# Patient Record
Sex: Female | Born: 1940 | Race: Black or African American | Hispanic: No | Marital: Single | Smoking: Never smoker
Health system: Southern US, Community
[De-identification: ages and names within clinical notes are randomized; demographics above are authoritative.]

## PROBLEM LIST (undated history)

## (undated) DIAGNOSIS — I10 Essential (primary) hypertension: Secondary | ICD-10-CM

## (undated) DIAGNOSIS — E119 Type 2 diabetes mellitus without complications: Secondary | ICD-10-CM

## (undated) DIAGNOSIS — N183 Chronic kidney disease, stage 3 unspecified: Secondary | ICD-10-CM

## (undated) DIAGNOSIS — E785 Hyperlipidemia, unspecified: Secondary | ICD-10-CM

## (undated) HISTORY — DX: Type 2 diabetes mellitus without complications: E11.9

## (undated) HISTORY — DX: Chronic kidney disease, stage 3 unspecified: N18.30

## (undated) HISTORY — DX: Hyperlipidemia, unspecified: E78.5

## (undated) HISTORY — DX: Essential (primary) hypertension: I10

---

## 1989-08-01 DIAGNOSIS — E039 Hypothyroidism, unspecified: Secondary | ICD-10-CM

## 1989-08-01 HISTORY — DX: Hypothyroidism, unspecified: E03.9

## 1999-06-15 ENCOUNTER — Encounter: Payer: Self-pay | Admitting: Internal Medicine

## 1999-06-15 ENCOUNTER — Encounter: Admission: RE | Admit: 1999-06-15 | Discharge: 1999-06-15 | Payer: Self-pay | Admitting: Internal Medicine

## 1999-06-17 ENCOUNTER — Ambulatory Visit (HOSPITAL_COMMUNITY): Admission: RE | Admit: 1999-06-17 | Discharge: 1999-06-17 | Payer: Self-pay | Admitting: Internal Medicine

## 1999-12-17 ENCOUNTER — Ambulatory Visit (HOSPITAL_COMMUNITY): Admission: RE | Admit: 1999-12-17 | Discharge: 1999-12-17 | Payer: Self-pay | Admitting: Internal Medicine

## 1999-12-22 ENCOUNTER — Ambulatory Visit (HOSPITAL_COMMUNITY): Admission: RE | Admit: 1999-12-22 | Discharge: 1999-12-22 | Payer: Self-pay | Admitting: Interventional Cardiology

## 1999-12-22 ENCOUNTER — Encounter: Payer: Self-pay | Admitting: Interventional Cardiology

## 2002-09-09 ENCOUNTER — Ambulatory Visit (HOSPITAL_COMMUNITY): Admission: RE | Admit: 2002-09-09 | Discharge: 2002-09-09 | Payer: Self-pay | Admitting: Internal Medicine

## 2009-05-22 ENCOUNTER — Encounter: Admission: RE | Admit: 2009-05-22 | Discharge: 2009-05-22 | Payer: Self-pay | Admitting: Internal Medicine

## 2010-01-14 ENCOUNTER — Encounter: Admission: RE | Admit: 2010-01-14 | Discharge: 2010-01-14 | Payer: Self-pay | Admitting: Internal Medicine

## 2010-08-22 ENCOUNTER — Encounter: Payer: Self-pay | Admitting: Internal Medicine

## 2014-09-11 DIAGNOSIS — Z1231 Encounter for screening mammogram for malignant neoplasm of breast: Secondary | ICD-10-CM | POA: Diagnosis not present

## 2014-09-23 DIAGNOSIS — D509 Iron deficiency anemia, unspecified: Secondary | ICD-10-CM | POA: Diagnosis not present

## 2014-09-23 DIAGNOSIS — Z23 Encounter for immunization: Secondary | ICD-10-CM | POA: Diagnosis not present

## 2014-09-23 DIAGNOSIS — Z0000 Encounter for general adult medical examination without abnormal findings: Secondary | ICD-10-CM | POA: Diagnosis not present

## 2014-09-23 DIAGNOSIS — E118 Type 2 diabetes mellitus with unspecified complications: Secondary | ICD-10-CM | POA: Diagnosis not present

## 2014-09-23 DIAGNOSIS — E780 Pure hypercholesterolemia: Secondary | ICD-10-CM | POA: Diagnosis not present

## 2014-09-23 DIAGNOSIS — Z6824 Body mass index (BMI) 24.0-24.9, adult: Secondary | ICD-10-CM | POA: Diagnosis not present

## 2014-09-23 DIAGNOSIS — D559 Anemia due to enzyme disorder, unspecified: Secondary | ICD-10-CM | POA: Diagnosis not present

## 2014-09-23 DIAGNOSIS — I10 Essential (primary) hypertension: Secondary | ICD-10-CM | POA: Diagnosis not present

## 2014-09-23 DIAGNOSIS — E039 Hypothyroidism, unspecified: Secondary | ICD-10-CM | POA: Diagnosis not present

## 2014-10-07 DIAGNOSIS — G5640 Causalgia of unspecified upper limb: Secondary | ICD-10-CM | POA: Diagnosis not present

## 2014-10-07 DIAGNOSIS — I10 Essential (primary) hypertension: Secondary | ICD-10-CM | POA: Diagnosis not present

## 2014-10-20 DIAGNOSIS — H35031 Hypertensive retinopathy, right eye: Secondary | ICD-10-CM | POA: Diagnosis not present

## 2014-11-19 DIAGNOSIS — I10 Essential (primary) hypertension: Secondary | ICD-10-CM | POA: Diagnosis not present

## 2014-11-19 DIAGNOSIS — E119 Type 2 diabetes mellitus without complications: Secondary | ICD-10-CM | POA: Diagnosis not present

## 2015-03-19 DIAGNOSIS — M545 Low back pain: Secondary | ICD-10-CM | POA: Diagnosis not present

## 2015-03-19 DIAGNOSIS — I10 Essential (primary) hypertension: Secondary | ICD-10-CM | POA: Diagnosis not present

## 2015-04-08 DIAGNOSIS — Z23 Encounter for immunization: Secondary | ICD-10-CM | POA: Diagnosis not present

## 2015-04-30 DIAGNOSIS — I10 Essential (primary) hypertension: Secondary | ICD-10-CM | POA: Diagnosis not present

## 2015-08-18 DIAGNOSIS — Z961 Presence of intraocular lens: Secondary | ICD-10-CM | POA: Diagnosis not present

## 2015-08-18 DIAGNOSIS — H26493 Other secondary cataract, bilateral: Secondary | ICD-10-CM | POA: Diagnosis not present

## 2015-08-18 DIAGNOSIS — H5021 Vertical strabismus, right eye: Secondary | ICD-10-CM | POA: Diagnosis not present

## 2015-09-16 DIAGNOSIS — Z1231 Encounter for screening mammogram for malignant neoplasm of breast: Secondary | ICD-10-CM | POA: Diagnosis not present

## 2015-09-28 DIAGNOSIS — M1990 Unspecified osteoarthritis, unspecified site: Secondary | ICD-10-CM | POA: Diagnosis not present

## 2015-09-28 DIAGNOSIS — D559 Anemia due to enzyme disorder, unspecified: Secondary | ICD-10-CM | POA: Diagnosis not present

## 2015-09-28 DIAGNOSIS — D539 Nutritional anemia, unspecified: Secondary | ICD-10-CM | POA: Diagnosis not present

## 2015-09-28 DIAGNOSIS — G5600 Carpal tunnel syndrome, unspecified upper limb: Secondary | ICD-10-CM | POA: Diagnosis not present

## 2015-09-28 DIAGNOSIS — E039 Hypothyroidism, unspecified: Secondary | ICD-10-CM | POA: Diagnosis not present

## 2015-09-28 DIAGNOSIS — I10 Essential (primary) hypertension: Secondary | ICD-10-CM | POA: Diagnosis not present

## 2015-09-28 DIAGNOSIS — Z0000 Encounter for general adult medical examination without abnormal findings: Secondary | ICD-10-CM | POA: Diagnosis not present

## 2015-10-12 DIAGNOSIS — I10 Essential (primary) hypertension: Secondary | ICD-10-CM | POA: Diagnosis not present

## 2015-10-22 ENCOUNTER — Emergency Department (INDEPENDENT_AMBULATORY_CARE_PROVIDER_SITE_OTHER)
Admission: EM | Admit: 2015-10-22 | Discharge: 2015-10-22 | Disposition: A | Discharge status: Home / Self Care | Payer: Medicare Other | Source: Home / Self Care | Attending: Family Medicine | Admitting: Family Medicine

## 2015-10-22 ENCOUNTER — Encounter (HOSPITAL_COMMUNITY): Payer: Self-pay | Admitting: Emergency Medicine

## 2015-10-22 DIAGNOSIS — L308 Other specified dermatitis: Principal | ICD-10-CM

## 2015-10-22 DIAGNOSIS — B028 Zoster with other complications: Secondary | ICD-10-CM

## 2015-10-22 MED ORDER — VALACYCLOVIR HCL 1 G PO TABS: 1000.0000 mg | ORAL_TABLET | Freq: Three times a day (TID) | ORAL | Status: AC

## 2015-10-22 NOTE — ED Provider Notes (Addendum)
CSN: [id number]     Arrival date & time 10/22/15  1351 History   First MD Initiated Contact with Patient 10/22/15 1648     Chief Complaint  Patient presents with  . Hip Pain  . Pelvic Pain   (Consider location/radiation/quality/duration/timing/severity/associated sxs/prior Treatment) Patient is a 75 y.o. female presenting with hip pain and pelvic pain. The history is provided by the patient. No language interpreter was used.  Hip Pain Pertinent negatives include no abdominal pain.  Pelvic Pain Pertinent negatives include no abdominal pain.  Patient presents with complaint of R sided inguinal discomfort, describes as a 'puffiness' which came on in association with a rash at the same time. The rash was pruritic initially, has been using otc hydrocortisone cream and is drying up, less itchy.  No changes in hygiene products, no other changes.  On ROS she notes she has had some increasing nausea over time, much before the chief complaint mentioned above.   PMHx; primary doctor is Dr. Levin Erp.   No past medical history on file. No past surgical history on file. No family history on file. Social History  Substance Use Topics  . Smoking status: Not on file  . Smokeless tobacco: Not on file  . Alcohol Use: Not on file   OB History    No data available     Review of Systems  Constitutional: Negative for fever, chills, diaphoresis and fatigue.  Gastrointestinal: Positive for nausea. Negative for vomiting, abdominal pain, diarrhea, constipation, blood in stool, abdominal distention and rectal pain.  Endocrine: Negative for polyuria.  Genitourinary: Positive for pelvic pain. Negative for dysuria, frequency, flank pain, difficulty urinating and dyspareunia.  Skin: Positive for rash.    Allergies  Review of patient's allergies indicates not on file.  Home Medications   Prior to Admission medications   Not on File   Meds Ordered and Administered this Visit  Medications - No data  to display  BP 163/70 mmHg  Pulse 62  Temp(Src) 98.4 F (36.9 C) (Oral)  Resp 16  SpO2 99% No data found.   Physical Exam  Constitutional: She appears well-developed and well-nourished. No distress.  HENT:  Head: Normocephalic.  Neck: Normal range of motion. Neck supple.  Cardiovascular: Normal rate, regular rhythm and normal heart sounds.   No murmur heard. Abdominal: Soft. She exhibits no distension. There is no tenderness. There is no rebound and no guarding.  Lymphadenopathy:    She has no cervical adenopathy.  Neurological:  Patient reports tingling along anterior aspect of RIGHT thigh, around R buttock.   Full strength hip flexion bilaterally.   Skin: She is not diaphoretic.  RIGHT anterior thigh with patch of erythema, with grouping of vesicular lesions in early phase.     ED Course  Procedures (including critical care time)  Labs Review Labs Reviewed - No data to display  Imaging Review No results found.   Visual Acuity Review  Right Eye Distance:   Left Eye Distance:   Bilateral Distance:    Right Eye Near:   Left Eye Near:    Bilateral Near:         MDM  No diagnosis found. Zoster, Right thigh.  Patient has had shingles vaccine. To start Valtrex 1g po tid for 7 days, to follow up with her primary doctor, Dr. Nyoka Cowden, in the coming week.   Dalbert Mayotte, MD    Willeen Niece, MD 10/22/15 [place], MD 10/22/15 [phone]

## 2015-10-22 NOTE — ED Notes (Signed)
Patient c/o right side pevic pain x 2 days. Patient reports she has had swelling and pain that keeps her up at night. Patient is in NAD. Pain radiates to her leg.

## 2015-10-22 NOTE — Discharge Instructions (Signed)
It is a pleasure to see you today.  I believe the rash on your right thigh is shingles, which is caused by a virus.   I am prescribing you an antiviral medication, VALTREX 1000mg , take 1 tablet by mouth three times daily for seven days.  IT IS IMPORTANT TO BEGIN THIS MEDICATION AS SOON AS POSSIBLE, TO GET THE MOST BENEFIT IN PREVENTING CONTINUED DISCOMFORT FROM SHINGLES.   I recommend following up with your primary doctor, Dr. Nyoka Cowden, in the coming 1-2 weeks, or sooner if needed.

## 2015-11-16 DIAGNOSIS — I119 Hypertensive heart disease without heart failure: Secondary | ICD-10-CM | POA: Diagnosis not present

## 2015-11-16 DIAGNOSIS — B029 Zoster without complications: Secondary | ICD-10-CM | POA: Diagnosis not present

## 2015-12-10 DIAGNOSIS — I119 Hypertensive heart disease without heart failure: Secondary | ICD-10-CM | POA: Diagnosis not present

## 2015-12-10 DIAGNOSIS — B029 Zoster without complications: Secondary | ICD-10-CM | POA: Diagnosis not present

## 2016-01-26 DIAGNOSIS — Z23 Encounter for immunization: Secondary | ICD-10-CM | POA: Diagnosis not present

## 2016-05-09 DIAGNOSIS — I10 Essential (primary) hypertension: Secondary | ICD-10-CM | POA: Diagnosis not present

## 2016-05-09 DIAGNOSIS — Z23 Encounter for immunization: Secondary | ICD-10-CM | POA: Diagnosis not present

## 2016-05-09 DIAGNOSIS — R079 Chest pain, unspecified: Secondary | ICD-10-CM | POA: Diagnosis not present

## 2016-06-16 ENCOUNTER — Ambulatory Visit
Admission: RE | Admit: 2016-06-16 | Discharge: 2016-06-16 | Disposition: A | Discharge status: Home / Self Care | Payer: Medicare Other | Source: Ambulatory Visit | Attending: Internal Medicine | Admitting: Internal Medicine

## 2016-06-16 ENCOUNTER — Other Ambulatory Visit: Payer: Self-pay | Admitting: Internal Medicine

## 2016-06-16 DIAGNOSIS — E039 Hypothyroidism, unspecified: Secondary | ICD-10-CM | POA: Diagnosis not present

## 2016-06-16 DIAGNOSIS — D559 Anemia due to enzyme disorder, unspecified: Secondary | ICD-10-CM | POA: Diagnosis not present

## 2016-06-16 DIAGNOSIS — R0602 Shortness of breath: Secondary | ICD-10-CM | POA: Diagnosis not present

## 2016-06-16 DIAGNOSIS — I10 Essential (primary) hypertension: Secondary | ICD-10-CM | POA: Diagnosis not present

## 2016-06-17 DIAGNOSIS — I10 Essential (primary) hypertension: Secondary | ICD-10-CM | POA: Diagnosis not present

## 2016-07-08 DIAGNOSIS — I10 Essential (primary) hypertension: Secondary | ICD-10-CM | POA: Diagnosis not present

## 2016-08-22 ENCOUNTER — Other Ambulatory Visit (HOSPITAL_BASED_OUTPATIENT_CLINIC_OR_DEPARTMENT_OTHER): Payer: Self-pay

## 2016-08-22 DIAGNOSIS — R0683 Snoring: Secondary | ICD-10-CM

## 2016-08-22 DIAGNOSIS — R5383 Other fatigue: Secondary | ICD-10-CM

## 2016-08-22 DIAGNOSIS — G4710 Hypersomnia, unspecified: Secondary | ICD-10-CM

## 2016-08-28 ENCOUNTER — Ambulatory Visit (HOSPITAL_BASED_OUTPATIENT_CLINIC_OR_DEPARTMENT_OTHER): Payer: Medicare Other | Attending: Family Medicine | Admitting: Internal Medicine

## 2016-08-28 VITALS — Ht <= 58 in | Wt 148.0 lb

## 2016-08-28 DIAGNOSIS — R400 Somnolence: Secondary | ICD-10-CM | POA: Insufficient documentation

## 2016-08-28 DIAGNOSIS — G4733 Obstructive sleep apnea (adult) (pediatric): Secondary | ICD-10-CM

## 2016-08-28 DIAGNOSIS — R0683 Snoring: Secondary | ICD-10-CM | POA: Diagnosis present

## 2016-08-28 DIAGNOSIS — R5383 Other fatigue: Secondary | ICD-10-CM | POA: Diagnosis present

## 2016-08-28 DIAGNOSIS — G4710 Hypersomnia, unspecified: Secondary | ICD-10-CM

## 2016-09-03 DIAGNOSIS — G4733 Obstructive sleep apnea (adult) (pediatric): Secondary | ICD-10-CM

## 2016-09-03 NOTE — Procedures (Signed)
  Patient Name: Gloria Bowen, Flot Date: 08/28/2016 Gender: Female D.O.B: June 10, 1941 Age (years): 75 Referring Provider: Carlos Levering Height (inches): 28 Interpreting Physician: Baird Lyons MD, ABSM Weight (lbs): 148 RPSGT: Jonna Coup BMI: 31 MRN: [id number] Neck Size: 15.50 CLINICAL INFORMATION Sleep Study Type: NPSG  Indication for sleep study: Daytime Fatigue, Fatigue, Snoring  Epworth Sleepiness Score: 14  SLEEP STUDY TECHNIQUE As per the AASM Manual for the Scoring of Sleep and Associated Events v2.3 (April 2016) with a hypopnea requiring 4% desaturations.  The channels recorded and monitored were frontal, central and occipital EEG, electrooculogram (EOG), submentalis EMG (chin), nasal and oral airflow, thoracic and abdominal wall motion, anterior tibialis EMG, snore microphone, electrocardiogram, and pulse oximetry.  MEDICATIONS Medications self-administered by patient taken the night of the study : none reported  SLEEP ARCHITECTURE The study was initiated at 10:19:51 PM and ended at 4:31:49 AM.  Sleep onset time was 0.2 minutes and the sleep efficiency was 81.1%. The total sleep time was 301.8 minutes.  Stage REM latency was 50.0 minutes.  The patient spent 3.48% of the night in stage N1 sleep, 78.63% in stage N2 sleep, 0.00% in stage N3 and 17.89% in REM.  Alpha intrusion was absent.  Supine sleep was 26.42%.  RESPIRATORY PARAMETERS The overall apnea/hypopnea index (AHI) was 0.0 per hour. There were 0 total apneas, including 0 obstructive, 0 central and 0 mixed apneas. There were 0 hypopneas and 0 RERAs.  The AHI during Stage REM sleep was 0.0 per hour.  AHI while supine was 0.0 per hour.  The mean oxygen saturation was 98.10%. The minimum SpO2 during sleep was 94.00%.  Soft snoring was noted during this study.  CARDIAC DATA The 2 lead EKG demonstrated sinus rhythm. The mean heart rate was 55.93 beats per minute. Other EKG findings  include: None.  LEG MOVEMENT DATA The total PLMS were 792 with a resulting PLMS index of 157.45. Associated arousal with leg movement index was 7.4 .  IMPRESSIONS - No significant obstructive sleep apnea occurred during this study (AHI = 0.0/h). - No significant central sleep apnea occurred during this study (CAI = 0.0/h). - The patient had minimal or no oxygen desaturation during the study (Min O2 = 94.00%) - The patient snored with Soft snoring volume. - No cardiac abnormalities were noted during this study. - Severe periodic limb movements of sleep occurred during the study. Associated arousals were significant. - The patient chose not to take her usual doxazosin and amlodipine on this study night because she wanted to avoid the frequent nocturia and sleep disturbance she associates with home use of these medications.  DIAGNOSIS - Periodic Limb Movement Syndrome (327.51 [G47.61 ICD-10])  RECOMMENDATIONS - Consider specific therapy for limb-movement sleep disturbance. - Note the patient's comment associating her home medications with sleep disturbance due to nocturia. - Avoid alcohol, sedatives and other CNS depressants that may worsen sleep apnea and disrupt normal sleep architecture. - Sleep hygiene should be reviewed to assess factors that may improve sleep quality. - Weight management and regular exercise should be initiated or continued if appropriate.  [Electronically signed] 09/03/2016 10:26 AM  Baird Lyons MD, ABSM Diplomate, American Board of Sleep Medicine   NPI: [id number]  [place], American Board of Sleep Medicine  ELECTRONICALLY SIGNED ON:  09/03/2016, 10:21 AM [place] PH: [phone]   FX: [phone] [place]

## 2018-07-06 ENCOUNTER — Other Ambulatory Visit (HOSPITAL_COMMUNITY): Payer: Self-pay | Admitting: Internal Medicine

## 2018-07-06 DIAGNOSIS — R0602 Shortness of breath: Secondary | ICD-10-CM

## 2018-07-23 ENCOUNTER — Ambulatory Visit (HOSPITAL_COMMUNITY)
Admission: RE | Admit: 2018-07-23 | Discharge: 2018-07-23 | Disposition: A | Discharge status: Home / Self Care | Payer: Medicare Other | Source: Ambulatory Visit | Attending: Internal Medicine | Admitting: Internal Medicine

## 2018-07-23 DIAGNOSIS — I088 Other rheumatic multiple valve diseases: Secondary | ICD-10-CM | POA: Insufficient documentation

## 2018-07-23 DIAGNOSIS — R0602 Shortness of breath: Secondary | ICD-10-CM | POA: Diagnosis present

## 2018-07-23 NOTE — Progress Notes (Signed)
  Echocardiogram 2D Echocardiogram has been performed.  Gloria Bowen L Androw 07/23/2018, 2:27 PM

## 2018-08-09 ENCOUNTER — Other Ambulatory Visit: Payer: Self-pay | Admitting: Internal Medicine

## 2018-08-09 DIAGNOSIS — R0602 Shortness of breath: Secondary | ICD-10-CM

## 2018-08-13 ENCOUNTER — Ambulatory Visit (INDEPENDENT_AMBULATORY_CARE_PROVIDER_SITE_OTHER): Payer: Medicare Other | Admitting: Internal Medicine

## 2018-08-13 DIAGNOSIS — R0602 Shortness of breath: Secondary | ICD-10-CM

## 2018-08-13 LAB — PULMONARY FUNCTION TEST
DL/VA % pred: 88 %
DL/VA: 3.59 ml/min/mmHg/L
DLCO unc % pred: 57 %
DLCO unc: 10.11 ml/min/mmHg
FEF 25-75 Post: 0.93 L/sec
FEF 25-75 Pre: 0.64 L/sec
FEF2575-%CHANGE-POST: 45 %
FEF2575-%PRED-POST: 92 %
FEF2575-%Pred-Pre: 63 %
FEV1-%CHANGE-POST: 48 %
FEV1-%PRED-PRE: 71 %
FEV1-%Pred-Post: 106 %
FEV1-POST: 1.23 L
FEV1-Pre: 0.83 L
FEV1FVC-%CHANGE-POST: 59 %
FEV1FVC-%PRED-PRE: 59 %
FEV6-%Change-Post: -6 %
FEV6-%Pred-Post: 120 %
FEV6-%Pred-Pre: 129 %
FEV6-Post: 1.73 L
FEV6-Pre: 1.85 L
FEV6FVC-%Pred-Post: 106 %
FEV6FVC-%Pred-Pre: 106 %
FVC-%CHANGE-POST: -6 %
FVC-%PRED-POST: 113 %
FVC-%Pred-Pre: 121 %
FVC-PRE: 1.85 L
FVC-Post: 1.73 L
POST FEV1/FVC RATIO: 71 %
PRE FEV6/FVC RATIO: 100 %
Post FEV6/FVC ratio: 100 %
Pre FEV1/FVC ratio: 45 %
RV % PRED: 78 %
RV: 1.66 L
TLC % pred: 83 %
TLC: 3.59 L

## 2018-08-13 NOTE — Progress Notes (Signed)
Patient completed full PFT today. Pt had a hard time with pre spiro and DLCO. Only able to pass one test in each of those testings out of multiply tries. She states she had a hard time breathing out, taking a long breath.

## 2018-08-23 ENCOUNTER — Telehealth: Payer: Self-pay | Admitting: Internal Medicine

## 2018-08-23 NOTE — Telephone Encounter (Signed)
PFT was faxed to the number provided

## 2018-08-28 ENCOUNTER — Telehealth: Payer: Self-pay | Admitting: Internal Medicine

## 2018-08-28 NOTE — Telephone Encounter (Signed)
PFT has been refaxed to [facility]. She is aware that this has been done. Nothing further was needed.

## 2019-08-13 DIAGNOSIS — G5600 Carpal tunnel syndrome, unspecified upper limb: Secondary | ICD-10-CM | POA: Insufficient documentation

## 2019-08-13 DIAGNOSIS — M1990 Unspecified osteoarthritis, unspecified site: Secondary | ICD-10-CM | POA: Insufficient documentation

## 2019-08-13 DIAGNOSIS — J4530 Mild persistent asthma, uncomplicated: Secondary | ICD-10-CM | POA: Insufficient documentation

## 2019-08-13 DIAGNOSIS — R809 Proteinuria, unspecified: Secondary | ICD-10-CM | POA: Insufficient documentation

## 2019-08-13 DIAGNOSIS — N1832 Chronic kidney disease, stage 3b: Secondary | ICD-10-CM | POA: Insufficient documentation

## 2019-08-13 DIAGNOSIS — E785 Hyperlipidemia, unspecified: Secondary | ICD-10-CM | POA: Insufficient documentation

## 2019-08-13 DIAGNOSIS — M545 Low back pain, unspecified: Secondary | ICD-10-CM | POA: Insufficient documentation

## 2019-08-13 DIAGNOSIS — E669 Obesity, unspecified: Secondary | ICD-10-CM | POA: Insufficient documentation

## 2019-08-13 DIAGNOSIS — E890 Postprocedural hypothyroidism: Secondary | ICD-10-CM | POA: Insufficient documentation

## 2019-08-13 DIAGNOSIS — Z860100 Personal history of colon polyps, unspecified: Secondary | ICD-10-CM | POA: Insufficient documentation

## 2019-08-13 DIAGNOSIS — G255 Other chorea: Secondary | ICD-10-CM | POA: Insufficient documentation

## 2019-08-13 DIAGNOSIS — I129 Hypertensive chronic kidney disease with stage 1 through stage 4 chronic kidney disease, or unspecified chronic kidney disease: Secondary | ICD-10-CM | POA: Insufficient documentation

## 2019-08-13 DIAGNOSIS — M899 Disorder of bone, unspecified: Secondary | ICD-10-CM | POA: Insufficient documentation

## 2019-08-13 DIAGNOSIS — E1121 Type 2 diabetes mellitus with diabetic nephropathy: Secondary | ICD-10-CM | POA: Insufficient documentation

## 2019-08-13 DIAGNOSIS — R259 Unspecified abnormal involuntary movements: Secondary | ICD-10-CM | POA: Insufficient documentation

## 2019-09-05 ENCOUNTER — Telehealth: Payer: Self-pay | Admitting: Hematology and Oncology

## 2019-09-05 NOTE — Telephone Encounter (Signed)
Received a new hem referral from [facility] for mgus. Ms. Hultin has been cld and scheduled to see Dr. Lorenso Courier on 2/15 at 9am. Pt aware to arrive 15 minutes early.

## 2019-09-16 ENCOUNTER — Other Ambulatory Visit: Payer: Medicare Other

## 2019-09-16 ENCOUNTER — Encounter: Payer: Medicare Other | Admitting: Hematology and Oncology

## 2019-09-26 DIAGNOSIS — Z0000 Encounter for general adult medical examination without abnormal findings: Secondary | ICD-10-CM | POA: Insufficient documentation

## 2019-10-02 DIAGNOSIS — E559 Vitamin D deficiency, unspecified: Secondary | ICD-10-CM | POA: Insufficient documentation

## 2019-12-06 ENCOUNTER — Other Ambulatory Visit: Payer: Self-pay

## 2019-12-06 ENCOUNTER — Encounter: Payer: Self-pay | Admitting: Podiatry

## 2019-12-06 ENCOUNTER — Ambulatory Visit (INDEPENDENT_AMBULATORY_CARE_PROVIDER_SITE_OTHER): Payer: Medicare Other | Admitting: Podiatry

## 2019-12-06 DIAGNOSIS — M79675 Pain in left toe(s): Secondary | ICD-10-CM

## 2019-12-06 DIAGNOSIS — M79674 Pain in right toe(s): Secondary | ICD-10-CM | POA: Diagnosis not present

## 2019-12-06 DIAGNOSIS — E119 Type 2 diabetes mellitus without complications: Secondary | ICD-10-CM

## 2019-12-06 DIAGNOSIS — B351 Tinea unguium: Secondary | ICD-10-CM

## 2019-12-06 NOTE — Progress Notes (Signed)
This patient returns to my office for at risk foot care.  This patient requires this care by a professional since this patient will be at risk due to having diabetes.    This patient is unable to cut nails herself since the patient cannot reach her nails.These nails are painful walking and wearing shoes.  This patient presents for at risk foot care today.  General Appearance  Alert, conversant and in no acute stress.  Vascular  Dorsalis pedis and posterior tibial  pulses are palpable  bilaterally.  Capillary return is within normal limits  bilaterally. Temperature is within normal limits  bilaterally.  Neurologic  Senn-Weinstein monofilament wire test within normal limits  bilaterally. Muscle power within normal limits bilaterally.  Nails Thick disfigured discolored nails with subungual debris  from hallux to third  toes bilaterally. No evidence of bacterial infection or drainage bilaterally.  Orthopedic  No limitations of motion  feet .  No crepitus or effusions noted.  No bony pathology or digital deformities noted.  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  No signs of infections or ulcers noted.     Onychomycosis  Pain in right toes  Pain in left toes  Consent was obtained for treatment procedures.   Mechanical debridement of nails 1-5  bilaterally performed with a nail nipper.  Filed with dremel without incident.    Return office visit  4 months                    Told patient to return for periodic foot care and evaluation due to potential at risk complications.   Rosio Weiss DPM  

## 2020-04-10 ENCOUNTER — Encounter: Payer: Self-pay | Admitting: Podiatry

## 2020-04-10 ENCOUNTER — Ambulatory Visit (INDEPENDENT_AMBULATORY_CARE_PROVIDER_SITE_OTHER): Payer: Medicare Other | Admitting: Podiatry

## 2020-04-10 ENCOUNTER — Other Ambulatory Visit: Payer: Self-pay

## 2020-04-10 DIAGNOSIS — B351 Tinea unguium: Secondary | ICD-10-CM | POA: Diagnosis not present

## 2020-04-10 DIAGNOSIS — E119 Type 2 diabetes mellitus without complications: Secondary | ICD-10-CM | POA: Diagnosis not present

## 2020-04-10 DIAGNOSIS — M79675 Pain in left toe(s): Secondary | ICD-10-CM | POA: Diagnosis not present

## 2020-04-10 DIAGNOSIS — M79674 Pain in right toe(s): Secondary | ICD-10-CM | POA: Diagnosis not present

## 2020-04-10 NOTE — Progress Notes (Signed)
This patient returns to my office for at risk foot care.  This patient requires this care by a professional since this patient will be at risk due to having diabetes.    This patient is unable to cut nails herself since the patient cannot reach her nails.These nails are painful walking and wearing shoes.  This patient presents for at risk foot care today.  General Appearance  Alert, conversant and in no acute stress.  Vascular  Dorsalis pedis and posterior tibial  pulses are palpable  bilaterally.  Capillary return is within normal limits  bilaterally. Temperature is within normal limits  bilaterally.  Neurologic  Senn-Weinstein monofilament wire test within normal limits  bilaterally. Muscle power within normal limits bilaterally.  Nails Thick disfigured discolored nails with subungual debris  from hallux to third  toes bilaterally. No evidence of bacterial infection or drainage bilaterally.  Orthopedic  No limitations of motion  feet .  No crepitus or effusions noted.  No bony pathology or digital deformities noted.  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  No signs of infections or ulcers noted.     Onychomycosis  Pain in right toes  Pain in left toes  Consent was obtained for treatment procedures.   Mechanical debridement of nails 1-5  bilaterally performed with a nail nipper.  Filed with dremel without incident.    Return office visit  4 months                    Told patient to return for periodic foot care and evaluation due to potential at risk complications.   Gardiner Barefoot DPM

## 2020-04-20 ENCOUNTER — Other Ambulatory Visit: Payer: Self-pay | Admitting: Internal Medicine

## 2020-04-20 DIAGNOSIS — M545 Low back pain, unspecified: Secondary | ICD-10-CM

## 2020-05-18 ENCOUNTER — Ambulatory Visit
Admission: RE | Admit: 2020-05-18 | Discharge: 2020-05-18 | Disposition: A | Discharge status: Home / Self Care | Payer: Medicare Other | Source: Ambulatory Visit | Attending: Internal Medicine | Admitting: Internal Medicine

## 2020-05-18 ENCOUNTER — Other Ambulatory Visit: Payer: Self-pay

## 2020-05-18 DIAGNOSIS — M545 Low back pain, unspecified: Secondary | ICD-10-CM

## 2020-06-13 ENCOUNTER — Other Ambulatory Visit: Payer: Self-pay

## 2020-06-13 ENCOUNTER — Ambulatory Visit: Payer: Self-pay | Attending: Internal Medicine

## 2020-06-13 DIAGNOSIS — Z23 Encounter for immunization: Secondary | ICD-10-CM

## 2020-06-13 NOTE — Progress Notes (Signed)
   Covid-19 Vaccination Clinic  Name:  Gloria Bowen    MRN: [id number] DOB: 1941/01/11  06/13/2020  [name] was observed post Covid-19 immunization for 15 minutes without incident. She was provided with Vaccine Information Sheet and instruction to access the V-Safe system.   [name] was instructed to call 911 with any severe reactions post vaccine: Marland Kitchen Difficulty breathing  . Swelling of face and throat  . A fast heartbeat  . A bad rash all over body  . Dizziness and weakness   Immunizations Administered    Name Date Dose VIS Date Route   Pfizer COVID-19 Vaccine 06/13/2020  1:16 PM 0.3 mL 05/20/2020 Intramuscular   Manufacturer: [place]   Lot: [id number]   [place]: [id number]

## 2020-08-07 ENCOUNTER — Ambulatory Visit (INDEPENDENT_AMBULATORY_CARE_PROVIDER_SITE_OTHER): Payer: Medicare Other | Admitting: Podiatry

## 2020-08-07 ENCOUNTER — Other Ambulatory Visit: Payer: Self-pay

## 2020-08-07 ENCOUNTER — Encounter: Payer: Self-pay | Admitting: Podiatry

## 2020-08-07 DIAGNOSIS — M2010 Hallux valgus (acquired), unspecified foot: Secondary | ICD-10-CM | POA: Diagnosis not present

## 2020-08-07 DIAGNOSIS — B351 Tinea unguium: Secondary | ICD-10-CM | POA: Diagnosis not present

## 2020-08-07 DIAGNOSIS — M2041 Other hammer toe(s) (acquired), right foot: Secondary | ICD-10-CM | POA: Insufficient documentation

## 2020-08-07 DIAGNOSIS — M79674 Pain in right toe(s): Secondary | ICD-10-CM

## 2020-08-07 DIAGNOSIS — E119 Type 2 diabetes mellitus without complications: Secondary | ICD-10-CM | POA: Diagnosis not present

## 2020-08-07 DIAGNOSIS — M2042 Other hammer toe(s) (acquired), left foot: Secondary | ICD-10-CM

## 2020-08-07 DIAGNOSIS — M79675 Pain in left toe(s): Secondary | ICD-10-CM

## 2020-08-07 NOTE — Progress Notes (Signed)
This patient returns to my office for at risk foot care.  This patient requires this care by a professional since this patient will be at risk due to having diabetes.    This patient is unable to cut nails herself since the patient cannot reach her nails.These nails are painful walking and wearing shoes.  This patient presents for at risk foot care today.  General Appearance  Alert, conversant and in no acute stress.  Vascular  Dorsalis pedis and posterior tibial  pulses are palpable  Right.  Dorsalis pedis and posterior tibial left non-palpable..  Capillary return is within normal limits  bilaterally. Temperature is within normal limits  bilaterally.  Neurologic  Senn-Weinstein monofilament wire test within normal limits  bilaterally. Muscle power within normal limits bilaterally.  Nails Thick disfigured discolored nails with subungual debris  from hallux to third  toes bilaterally. No evidence of bacterial infection or drainage bilaterally.  Orthopedic  No limitations of motion  feet .  No crepitus or effusions noted.  No bony pathology or digital deformities noted.  HAV  B/L.  Hammer toes  2,3  B/l  Skin  normotropic skin with no porokeratosis noted bilaterally.  No signs of infections or ulcers noted.     Onychomycosis  Pain in right toes  Pain in left toes  Consent was obtained for treatment procedures.   Mechanical debridement of nails 1-5  bilaterally performed with a nail nipper.  Filed with dremel without incident. 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   Return office visit  3 months                    Told patient to return for periodic foot care and evaluation due to potential at risk complications.   Maryelizabeth Eberle DPM  

## 2020-10-19 ENCOUNTER — Telehealth: Payer: Self-pay | Admitting: Podiatry

## 2020-10-19 NOTE — Telephone Encounter (Signed)
Pt left message checking on diabetic shoes status..   I returned call and explained that we have not gotten the paperwork back but I think it is a fax issue and have now faxed it to a new number for Dr Jacalyn Lefevre to sign off on.

## 2020-10-27 ENCOUNTER — Ambulatory Visit (HOSPITAL_COMMUNITY)
Admission: RE | Admit: 2020-10-27 | Discharge: 2020-10-27 | Disposition: A | Discharge status: Home / Self Care | Payer: Medicare Other | Source: Ambulatory Visit | Attending: Internal Medicine | Admitting: Internal Medicine

## 2020-10-27 ENCOUNTER — Other Ambulatory Visit (HOSPITAL_COMMUNITY): Payer: Self-pay | Admitting: Internal Medicine

## 2020-10-27 ENCOUNTER — Other Ambulatory Visit: Payer: Self-pay

## 2020-10-27 DIAGNOSIS — M79609 Pain in unspecified limb: Secondary | ICD-10-CM | POA: Insufficient documentation

## 2020-10-29 ENCOUNTER — Telehealth: Payer: Self-pay | Admitting: Physician Assistant

## 2020-10-29 NOTE — Telephone Encounter (Signed)
Received a new hem referral from Dr. Jacalyn Lefevre for mgus/anemia. I returned Gloria Bowen's call and scheduled her to see Murray Hodgkins on 4/18 at 11am. Pt requested to be seen later in April. Aware to arrive 20 minutes early. Letter mailed.

## 2020-10-29 NOTE — Telephone Encounter (Signed)
Created in error

## 2020-11-04 ENCOUNTER — Telehealth: Payer: Self-pay | Admitting: Podiatry

## 2020-11-04 NOTE — Telephone Encounter (Signed)
Pt left message stating she has an appt scheduled for Friday and wanted to know if her shoes are in and she would like to pick them up at the appt.  Returned call and told pt that the pcp put down her last appt was in 9.2021 and it has to be within 6 months. Pt states she was just seen 2 wks ago or so. I explained that the doctor signed off on 3.22 and put that date which is not valid. She is calling there office to discuss.

## 2020-11-06 ENCOUNTER — Other Ambulatory Visit: Payer: Self-pay

## 2020-11-06 ENCOUNTER — Ambulatory Visit (INDEPENDENT_AMBULATORY_CARE_PROVIDER_SITE_OTHER): Payer: Medicare Other | Admitting: Podiatry

## 2020-11-06 ENCOUNTER — Encounter: Payer: Self-pay | Admitting: Podiatry

## 2020-11-06 DIAGNOSIS — M2010 Hallux valgus (acquired), unspecified foot: Secondary | ICD-10-CM | POA: Diagnosis not present

## 2020-11-06 DIAGNOSIS — M2042 Other hammer toe(s) (acquired), left foot: Secondary | ICD-10-CM

## 2020-11-06 DIAGNOSIS — B351 Tinea unguium: Secondary | ICD-10-CM | POA: Diagnosis not present

## 2020-11-06 DIAGNOSIS — M2041 Other hammer toe(s) (acquired), right foot: Secondary | ICD-10-CM | POA: Diagnosis not present

## 2020-11-06 DIAGNOSIS — E119 Type 2 diabetes mellitus without complications: Secondary | ICD-10-CM

## 2020-11-06 DIAGNOSIS — M79675 Pain in left toe(s): Secondary | ICD-10-CM

## 2020-11-06 DIAGNOSIS — M79674 Pain in right toe(s): Secondary | ICD-10-CM

## 2020-11-06 NOTE — Progress Notes (Signed)
This patient returns to my office for at risk foot care.  This patient requires this care by a professional since this patient will be at risk due to having diabetes.    This patient is unable to cut nails herself since the patient cannot reach her nails.These nails are painful walking and wearing shoes.  This patient presents for at risk foot care today.  General Appearance  Alert, conversant and in no acute stress.  Vascular  Dorsalis pedis and posterior tibial  pulses are palpable  Right.  Dorsalis pedis and posterior tibial left non-palpable..  Capillary return is within normal limits  bilaterally. Temperature is within normal limits  bilaterally.  Neurologic  Senn-Weinstein monofilament wire test within normal limits  bilaterally. Muscle power within normal limits bilaterally.  Nails Thick disfigured discolored nails with subungual debris  from hallux to third  toes bilaterally. No evidence of bacterial infection or drainage bilaterally.  Orthopedic  No limitations of motion  feet .  No crepitus or effusions noted.  No bony pathology or digital deformities noted.  HAV  B/L.  Hammer toes  2,3  B/l  Skin  normotropic skin with no porokeratosis noted bilaterally.  No signs of infections or ulcers noted.     Onychomycosis  Pain in right toes  Pain in left toes  Consent was obtained for treatment procedures.   Mechanical debridement of nails 1-5  bilaterally performed with a nail nipper.  Filed with dremel without incident. 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   Return office visit  3 months                    Told patient to return for periodic foot care and evaluation due to potential at risk complications.   Gardiner Barefoot DPM

## 2020-11-13 NOTE — Progress Notes (Deleted)
[place] [phone]   [phone]  INITIAL CONSULT NOTE  Patient Care Team: Michael Boston, MD as PCP - General (Internal Medicine)  Hematological/Oncological History 1) Labs from PCP, Dr. Cristie Hem, [facility]: -10/14/2020: WBC 3.42(L), Hgb 10.2 (L), MCV 91.2, Plt 146, ANC 1.3 (L), Creatinine 1.4, Calcium 8.5.   2) 11/16/2020: Establish care with Dede Query PA-C  CHIEF COMPLAINTS/PURPOSE OF CONSULTATION:  "MGUS "  HISTORY OF PRESENTING ILLNESS:  Gloria Bowen 80 y.o. female with medical history significant for ***  On review of the previous records ***  On exam today ***  MEDICAL HISTORY:  No past medical history on file.  SURGICAL HISTORY: No past surgical history on file.  SOCIAL HISTORY: Social History   Socioeconomic History  . Marital status: Single    Spouse name: Not on file  . Number of children: Not on file  . Years of education: Not on file  . Highest education level: Not on file  Occupational History  . Not on file  Tobacco Use  . Smoking status: Never Smoker  . Smokeless tobacco: Never Used  Substance and Sexual Activity  . Alcohol use: Never  . Drug use: Never  . Sexual activity: Not on file  Other Topics Concern  . Not on file  Social History Narrative  . Not on file   Social Determinants of Health   Financial Resource Strain: Not on file  Food Insecurity: Not on file  Transportation Needs: Not on file  Physical Activity: Not on file  Stress: Not on file  Social Connections: Not on file  Intimate Partner Violence: Not on file    FAMILY HISTORY: No family history on file.  ALLERGIES:  is allergic to nabumetone and ramipril.  MEDICATIONS:  Current Outpatient Medications  Medication Sig Dispense Refill  . amLODipine (NORVASC) 10 MG tablet Take 10 mg by mouth daily.    Marland Kitchen aspirin 81 MG tablet Take 81 mg by mouth daily.    Marland Kitchen b complex vitamins capsule Take 1 capsule by  mouth daily.    Marland Kitchen doxazosin (CARDURA) 4 MG tablet Take 4 mg by mouth daily.    . ferrous sulfate 325 (65 FE) MG tablet Take 325 mg by mouth daily with breakfast.    . furosemide (LASIX) 20 MG tablet     . levothyroxine (SYNTHROID) 88 MCG tablet     . losartan (COZAAR) 100 MG tablet Take 100 mg by mouth daily.    . metoprolol succinate (TOPROL-XL) 100 MG 24 hr tablet Take 100 mg by mouth daily.    . Potassium Chloride ER 20 MEQ TBCR Take 1 tablet by mouth daily.    Grant Ruts INHUB 250-50 MCG/DOSE AEPB Inhale 1 puff into the lungs 2 (two) times daily.     No current facility-administered medications for this visit.    REVIEW OF SYSTEMS:   Constitutional: ( - ) fevers, ( - )  chills , ( - ) night sweats Eyes: ( - ) blurriness of vision, ( - ) double vision, ( - ) watery eyes Ears, nose, mouth, throat, and face: ( - ) mucositis, ( - ) sore throat Respiratory: ( - ) cough, ( - ) dyspnea, ( - ) wheezes Cardiovascular: ( - ) palpitation, ( - ) chest discomfort, ( - ) lower extremity swelling Gastrointestinal:  ( - ) nausea, ( - ) heartburn, ( - ) change in bowel habits Skin: ( - ) abnormal skin rashes Lymphatics: ( - )  new lymphadenopathy, ( - ) easy bruising Neurological: ( - ) numbness, ( - ) tingling, ( - ) new weaknesses Behavioral/Psych: ( - ) mood change, ( - ) new changes  All other systems were reviewed with the patient and are negative.  PHYSICAL EXAMINATION: ECOG PERFORMANCE STATUS: {CHL ONC ECOG PS:[id number]}  There were no vitals filed for this visit. There were no vitals filed for this visit.  GENERAL: well appearing *** in NAD  SKIN: skin color, texture, turgor are normal, no rashes or significant lesions EYES: conjunctiva are pink and non-injected, sclera clear OROPHARYNX: no exudate, no erythema; lips, buccal mucosa, and tongue normal  NECK: supple, non-tender LYMPH:  no palpable lymphadenopathy in the cervical, axillary or supraclavicular lymph nodes.  LUNGS: clear to  auscultation and percussion with normal breathing effort HEART: regular rate & rhythm and no murmurs and no lower extremity edema ABDOMEN: soft, non-tender, non-distended, normal bowel sounds Musculoskeletal: no cyanosis of digits and no clubbing  PSYCH: alert & oriented x 3, fluent speech NEURO: no focal motor/sensory deficits  LABORATORY DATA:  I have reviewed the data as listed No flowsheet data found.  No flowsheet data found.   PATHOLOGY: ***  BLOOD FILM: *** Review of the peripheral blood smear showed normal appearing white cells with neutrophils that were appropriately lobated and granulated. There was no predominance of bi-lobed or hyper-segmented neutrophils appreciated. No Dohle bodies were noted. There was no left shifting, immature forms or blasts noted. Lymphocytes remain normal in size without any predominance of large granular lymphocytes. Red cells show no anisopoikilocytosis, macrocytes , microcytes or polychromasia. There were no schistocytes, target cells, echinocytes, acanthocytes, dacrocytes, or stomatocytes.There was no rouleaux formation, nucleated red cells, or intra-cellular inclusions noted. The platelets are normal in size, shape, and color without any clumping evident.  RADIOGRAPHIC STUDIES: I have personally reviewed the radiological images as listed and agreed with the findings in the report. VAS [place] ABI WITH/WO TBI  Result Date: 10/27/2020 LOWER EXTREMITY DOPPLER STUDY Indications: Claudication, and peripheral artery disease. High Risk Factors: Diabetes, no history of smoking. Other Factors: Patient complains of burning in the lower extremities bilaterally                at night and while walking.  Performing Technologist: Delorise Shiner RVT  Examination Guidelines: A complete evaluation includes at minimum, Doppler waveform signals and systolic blood pressure reading at the level of bilateral brachial, anterior tibial, and posterior tibial arteries, when vessel  segments are accessible. Bilateral testing is considered an integral part of a complete examination. Photoelectric Plethysmograph (PPG) waveforms and toe systolic pressure readings are included as required and additional duplex testing as needed. Limited examinations for reoccurring indications may be performed as noted.  ABI Findings: +---------+------------------+-----+---------+--------+ Right    Rt Pressure (mmHg)IndexWaveform Comment  +---------+------------------+-----+---------+--------+ Brachial 165                                      +---------+------------------+-----+---------+--------+ ATA      125               0.76                   +---------+------------------+-----+---------+--------+ PTA      158               0.96 triphasic         +---------+------------------+-----+---------+--------+ DP  triphasic         +---------+------------------+-----+---------+--------+ Great Toe143               0.87                   +---------+------------------+-----+---------+--------+ +---------+------------------+-----+---------+-------+ Left     Lt Pressure (mmHg)IndexWaveform Comment +---------+------------------+-----+---------+-------+ Brachial 161                                     +---------+------------------+-----+---------+-------+ ATA      136               0.82                  +---------+------------------+-----+---------+-------+ PTA      133               0.81 triphasic        +---------+------------------+-----+---------+-------+ DP                              triphasic        +---------+------------------+-----+---------+-------+ Great Toe138               0.84                  +---------+------------------+-----+---------+-------+ +-------+-----------+-----------+------------+------------+ ABI/TBIToday's ABIToday's TBIPrevious ABIPrevious TBI  +-------+-----------+-----------+------------+------------+ Right  0.96       0.87                                +-------+-----------+-----------+------------+------------+ Left   0.82       0.84                                +-------+-----------+-----------+------------+------------+  Summary: Right: Resting right ankle-brachial index is within normal range. No evidence of significant right lower extremity arterial disease. The right toe-brachial index is abnormal. RT great toe pressure = 143 mmHg. 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 The left toe-brachial index is normal. LT Great toe pressure = 138 mmHg.  *See table(s) above for measurements and observations.  Electronically signed by Monica Martinez MD on 10/27/2020 at 4:29:02 PM.    Final     ASSESSMENT & PLAN ***  No orders of the defined types were placed in this encounter.   All questions were answered. The patient knows to call the clinic with any problems, questions or concerns.  A total of more than {CHL ONC TIME VISIT - VQXIH:[id number]} were spent on this encounter and over half of that time was spent on counseling and coordination of care as outlined above.    Dede Query, PA-C Department of Hematology/Oncology [place] at [facility] Phone: [phone]

## 2020-11-16 ENCOUNTER — Inpatient Hospital Stay: Payer: Medicare Other | Admitting: Physician Assistant

## 2020-11-16 ENCOUNTER — Inpatient Hospital Stay: Payer: Medicare Other

## 2020-11-20 ENCOUNTER — Other Ambulatory Visit: Payer: Self-pay

## 2020-11-20 ENCOUNTER — Encounter: Payer: Self-pay | Admitting: Physician Assistant

## 2020-11-20 ENCOUNTER — Inpatient Hospital Stay: Payer: Medicare Other

## 2020-11-20 ENCOUNTER — Inpatient Hospital Stay: Payer: Medicare Other | Attending: Physician Assistant | Admitting: Physician Assistant

## 2020-11-20 VITALS — BP 144/63 | HR 68 | Temp 98.0°F | Resp 18 | Ht <= 58 in | Wt 147.2 lb

## 2020-11-20 DIAGNOSIS — E039 Hypothyroidism, unspecified: Secondary | ICD-10-CM | POA: Diagnosis not present

## 2020-11-20 DIAGNOSIS — M25552 Pain in left hip: Secondary | ICD-10-CM | POA: Diagnosis not present

## 2020-11-20 DIAGNOSIS — D649 Anemia, unspecified: Secondary | ICD-10-CM | POA: Insufficient documentation

## 2020-11-20 DIAGNOSIS — M545 Low back pain, unspecified: Secondary | ICD-10-CM | POA: Diagnosis not present

## 2020-11-20 DIAGNOSIS — R0602 Shortness of breath: Secondary | ICD-10-CM | POA: Diagnosis not present

## 2020-11-20 DIAGNOSIS — E785 Hyperlipidemia, unspecified: Secondary | ICD-10-CM | POA: Insufficient documentation

## 2020-11-20 DIAGNOSIS — E1122 Type 2 diabetes mellitus with diabetic chronic kidney disease: Secondary | ICD-10-CM | POA: Insufficient documentation

## 2020-11-20 DIAGNOSIS — Z803 Family history of malignant neoplasm of breast: Secondary | ICD-10-CM | POA: Insufficient documentation

## 2020-11-20 DIAGNOSIS — E669 Obesity, unspecified: Secondary | ICD-10-CM | POA: Diagnosis not present

## 2020-11-20 DIAGNOSIS — R5383 Other fatigue: Secondary | ICD-10-CM | POA: Insufficient documentation

## 2020-11-20 DIAGNOSIS — E1140 Type 2 diabetes mellitus with diabetic neuropathy, unspecified: Secondary | ICD-10-CM | POA: Diagnosis not present

## 2020-11-20 DIAGNOSIS — N183 Chronic kidney disease, stage 3 unspecified: Secondary | ICD-10-CM | POA: Insufficient documentation

## 2020-11-20 DIAGNOSIS — D472 Monoclonal gammopathy: Secondary | ICD-10-CM

## 2020-11-20 DIAGNOSIS — I129 Hypertensive chronic kidney disease with stage 1 through stage 4 chronic kidney disease, or unspecified chronic kidney disease: Secondary | ICD-10-CM | POA: Diagnosis not present

## 2020-11-20 DIAGNOSIS — M25551 Pain in right hip: Secondary | ICD-10-CM | POA: Insufficient documentation

## 2020-11-20 DIAGNOSIS — Z801 Family history of malignant neoplasm of trachea, bronchus and lung: Secondary | ICD-10-CM | POA: Insufficient documentation

## 2020-11-20 LAB — RETIC PANEL
Immature Retic Fract: 6.1 % (ref 2.3–15.9)
RBC.: 3.61 MIL/uL — ABNORMAL LOW (ref 3.87–5.11)
Retic Count, Absolute: 49.5 10*3/uL (ref 19.0–186.0)
Retic Ct Pct: 1.4 % (ref 0.4–3.1)
Reticulocyte Hemoglobin: 33.2 pg (ref >27.9)

## 2020-11-20 LAB — CBC WITH DIFFERENTIAL (CANCER CENTER ONLY)
Abs Immature Granulocytes: 0 10*3/uL (ref 0.00–0.07)
Basophils Absolute: 0 10*3/uL (ref 0.0–0.1)
Basophils Relative: 0 %
Eosinophils Absolute: 0.1 10*3/uL (ref 0.0–0.5)
Eosinophils Relative: 3 %
HCT: 31.3 % — ABNORMAL LOW (ref 36.0–46.0)
Hemoglobin: 10.2 g/dL — ABNORMAL LOW (ref 12.0–15.0)
Immature Granulocytes: 0 %
Lymphocytes Relative: 45 %
Lymphs Abs: 1.5 10*3/uL (ref 0.7–4.0)
MCH: 28.3 pg (ref 26.0–34.0)
MCHC: 32.6 g/dL (ref 30.0–36.0)
MCV: 86.7 fL (ref 80.0–100.0)
Monocytes Absolute: 0.4 10*3/uL (ref 0.1–1.0)
Monocytes Relative: 13 %
Neutro Abs: 1.3 10*3/uL — ABNORMAL LOW (ref 1.7–7.7)
Neutrophils Relative %: 39 %
Platelet Count: 181 10*3/uL (ref 150–400)
RBC: 3.61 MIL/uL — ABNORMAL LOW (ref 3.87–5.11)
RDW: 13.5 % (ref 11.5–15.5)
WBC Count: 3.3 10*3/uL — ABNORMAL LOW (ref 4.0–10.5)
nRBC: 0 % (ref 0.0–0.2)

## 2020-11-20 LAB — CMP (CANCER CENTER ONLY)
ALT: 17 U/L (ref 0–44)
AST: 25 U/L (ref 15–41)
Albumin: 4.3 g/dL (ref 3.5–5.0)
Alkaline Phosphatase: 61 U/L (ref 38–126)
Anion gap: 10 (ref 5–15)
BUN: 29 mg/dL — ABNORMAL HIGH (ref 8–23)
CO2: 26 mmol/L (ref 22–32)
Calcium: 9.6 mg/dL (ref 8.9–10.3)
Chloride: 106 mmol/L (ref 98–111)
Creatinine: 1.36 mg/dL — ABNORMAL HIGH (ref 0.44–1.00)
GFR, Estimated: 39 mL/min — ABNORMAL LOW (ref >60)
Glucose, Bld: 96 mg/dL (ref 70–99)
Potassium: 3.9 mmol/L (ref 3.5–5.1)
Sodium: 142 mmol/L (ref 135–145)
Total Bilirubin: 0.5 mg/dL (ref 0.3–1.2)
Total Protein: 8 g/dL (ref 6.5–8.1)

## 2020-11-20 LAB — IRON AND TIBC
Iron: 64 ug/dL (ref 41–142)
Saturation Ratios: 22 % (ref 21–57)
TIBC: 288 ug/dL (ref 236–444)
UIBC: 223 ug/dL (ref 120–384)

## 2020-11-20 LAB — FERRITIN: Ferritin: 509 ng/mL — ABNORMAL HIGH (ref 11–307)

## 2020-11-20 LAB — VITAMIN B12: Vitamin B-12: 890 pg/mL (ref 180–914)

## 2020-11-20 LAB — FOLATE: Folate: 57.9 ng/mL (ref >5.9)

## 2020-11-20 LAB — LACTATE DEHYDROGENASE: LDH: 230 U/L — ABNORMAL HIGH (ref 98–192)

## 2020-11-20 NOTE — Progress Notes (Signed)
[place] [phone]   Fax:(336) [phone]  INITIAL CONSULT NOTE  Patient Care Team: Michael Boston, MD as PCP - General (Internal Medicine)  Hematological/Oncological History 1) Labs from Dr. Cristie Hem [facility]: -10/14/2020: WBC 3.42 (L), Hgb 10.2 (L), MCV 91.2, Plt 146, ANC 1.2,   2) 11/20/2020: Establish care with Dede Query  CHIEF COMPLAINTS/PURPOSE OF CONSULTATION:  "MGUS/Anemia "  HISTORY OF PRESENTING ILLNESS:  Gloria Bowen 80 y.o. female with medical history significant for CKD stage 3, hypothyroidism, T2DM, HTN, hyperlipidemia and obesity. Gloria Bowen is unaccompanied for this visit. She reports overall stable energy levels with intermittent episodes of fatigue. She lives alone and completes all her ADLs on her own. Patient has a good appetite without any noticeable weight changes. Patient denies any nausea, vomiting or abdominal pain. She has daily bowel movements without diarrhea or constipation. Patient denies easy bruising or other signs of bleeding including hematochezia, melena, hematuria, epistaxis, hemoptysis or gum bleeding. 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 She endorses neuropathy does interfere with her balance so she uses a cane to ambulate. Patient has chronic bilateral hip pain and low back pain. She denies any recent fractures. Patient has chronic shortness of breath with exertion but none at rest. Patient denies any fevers, chills, night sweats, chest pain or cough. She has no other complaints. Rest of the 10 point ROS is below.   MEDICAL HISTORY:  Past Medical History:  Diagnosis Date  . CKD (chronic kidney disease) stage 3, GFR 30-59 ml/min (HCC)   . Diabetes ([place])   . HTN (hypertension)   . Hyperlipidemia   . 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 thyroid nodule  resection  1991    SOCIAL HISTORY: Social History   Socioeconomic History  . Marital status: Single    Spouse name: Not on file  . Number of children: Not on file  . Years of education: Not on file  . Highest education level: Not on file  Occupational History  . Not on file  Tobacco Use  . Smoking status: Never Smoker  . Smokeless tobacco: Never Used  Substance and Sexual Activity  . Alcohol use: Never  . Drug use: Never  . Sexual activity: Not on file  Other Topics Concern  . Not on file  Social History Narrative  . Not on file   Social Determinants of Health   Financial Resource Strain: Not on file  Food Insecurity: Not on file  Transportation Needs: Not on file  Physical Activity: Not on file  Stress: Not on file  Social Connections: Not on file  Intimate Partner Violence: Not on file    FAMILY HISTORY: Family History  Problem Relation Age of Onset  . Breast cancer Sister   . Lung cancer Mother        smokeless tobacco use.     ALLERGIES:  is allergic to nabumetone and ramipril.  MEDICATIONS:  Current Outpatient Medications  Medication Sig Dispense Refill  . amLODipine (NORVASC) 10 MG tablet Take 10 mg by mouth daily.    Marland Kitchen aspirin 81 MG tablet Take 81 mg by mouth daily.    Marland Kitchen b complex vitamins capsule Take 1 capsule by mouth daily.    Marland Kitchen doxazosin (CARDURA) 4 MG tablet Take 4 mg by mouth daily.    . ferrous sulfate 325 (65 FE) MG tablet  Take 325 mg by mouth daily with breakfast.    . furosemide (LASIX) 20 MG tablet     . levothyroxine (SYNTHROID) 88 MCG tablet     . losartan (COZAAR) 100 MG tablet Take 100 mg by mouth daily.    . metoprolol succinate (TOPROL-XL) 100 MG 24 hr tablet Take 100 mg by mouth daily.    . Potassium Chloride ER 20 MEQ TBCR Take 1 tablet by mouth daily.    Grant Ruts INHUB 250-50 MCG/DOSE AEPB Inhale 1 puff into the lungs 2 (two) times daily.     No current facility-administered medications for this visit.    REVIEW OF SYSTEMS:    Constitutional: ( - ) fevers, ( - )  chills , ( - ) night sweats Eyes: ( - ) blurriness of vision, ( - ) double vision, ( - ) watery eyes Ears, nose, mouth, throat, and face: ( - ) mucositis, ( - ) sore throat Respiratory: ( - ) cough, ( - ) dyspnea, ( - ) wheezes Cardiovascular: ( - ) palpitation, ( - ) chest discomfort, ( - ) lower extremity swelling Gastrointestinal:  ( - ) nausea, ( - ) heartburn, ( - ) change in bowel habits Skin: ( - ) abnormal skin rashes Lymphatics: ( - ) new lymphadenopathy, ( - ) easy bruising Neurological: ( + ) numbness, ( - ) tingling, ( - ) new weaknesses Behavioral/Psych: ( - ) mood change, ( - ) new changes  All other systems were reviewed with the patient and are negative.  PHYSICAL EXAMINATION: ECOG PERFORMANCE STATUS: 0 - Asymptomatic  Vitals:   11/20/20 1327  BP: (!) 144/63  Pulse: 68  Resp: 18  Temp: 98 F (36.7 C)  SpO2: 100%   Filed Weights   11/20/20 1327  Weight: 147 lb 3.2 oz (66.8 kg)    GENERAL: well appearing African American female in NAD  SKIN: skin color, texture, turgor are normal, no rashes or significant lesions EYES: conjunctiva are pink and non-injected, sclera clear OROPHARYNX: no exudate, no erythema; lips, buccal mucosa, and tongue normal  NECK: supple, non-tender LYMPH:  no palpable lymphadenopathy in the cervical, axillary or supraclavicular lymph nodes.  LUNGS: clear to auscultation and percussion with normal breathing effort HEART: regular rate & rhythm and no murmurs and no lower extremity edema ABDOMEN: soft, non-tender, non-distended, normal bowel sounds Musculoskeletal: no cyanosis of digits and no clubbing  PSYCH: alert & oriented x 3, fluent speech NEURO: no focal motor/sensory deficits  LABORATORY DATA:  I have reviewed the data as listed No flowsheet data found.  No flowsheet data found.  RADIOGRAPHIC STUDIES: I have personally reviewed the radiological images as listed and agreed with the findings  in the report. VAS [place] ABI WITH/WO TBI  Result Date: 10/27/2020 LOWER EXTREMITY DOPPLER STUDY Indications: Claudication, and peripheral artery disease. High Risk Factors: Diabetes, no history of smoking. Other Factors: Patient complains of burning in the lower extremities bilaterally                at night and while walking.  Performing Technologist: Delorise Shiner RVT  Examination Guidelines: A complete evaluation includes at minimum, Doppler waveform signals and systolic blood pressure reading at the level of bilateral brachial, anterior tibial, and posterior tibial arteries, when vessel segments are accessible. Bilateral testing is considered an integral part of a complete examination. Photoelectric Plethysmograph (PPG) waveforms and toe systolic pressure readings are included as required and additional duplex testing as needed. Limited examinations for  reoccurring indications may be performed as noted.  ABI Findings: +---------+------------------+-----+---------+--------+ Right    Rt Pressure (mmHg)IndexWaveform Comment  +---------+------------------+-----+---------+--------+ Brachial 165                                      +---------+------------------+-----+---------+--------+ ATA      125               0.76                   +---------+------------------+-----+---------+--------+ PTA      158               0.96 triphasic         +---------+------------------+-----+---------+--------+ DP                              triphasic         +---------+------------------+-----+---------+--------+ Great Toe143               0.87                   +---------+------------------+-----+---------+--------+ +---------+------------------+-----+---------+-------+ Left     Lt Pressure (mmHg)IndexWaveform Comment +---------+------------------+-----+---------+-------+ Brachial 161                                     +---------+------------------+-----+---------+-------+ ATA       136               0.82                  +---------+------------------+-----+---------+-------+ PTA      133               0.81 triphasic        +---------+------------------+-----+---------+-------+ DP                              triphasic        +---------+------------------+-----+---------+-------+ Great Toe138               0.84                  +---------+------------------+-----+---------+-------+ +-------+-----------+-----------+------------+------------+ ABI/TBIToday's ABIToday's TBIPrevious ABIPrevious TBI +-------+-----------+-----------+------------+------------+ Right  0.96       0.87                                +-------+-----------+-----------+------------+------------+ Left   0.82       0.84                                +-------+-----------+-----------+------------+------------+  Summary: Right: Resting right ankle-brachial index is within normal range. No evidence of significant right lower extremity arterial disease. The right toe-brachial index is abnormal. RT great toe pressure = 143 mmHg. 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 The left toe-brachial index is normal. LT Great toe pressure = 138 mmHg.  *See table(s) above for measurements and observations.  Electronically signed by Monica Martinez MD on 10/27/2020 at 4:29:02 PM.    Final     ASSESSMENT & PLAN [place] is a 80 y.o. 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 Recommend full MGUS workup with CBC, CMP, multiple myeloma panel, kappa/lambda light chain, UPEP, beta 2 microglobulin and LDH levels. Bone Survey will be obtained to evaluate for lytic lesions. Plan to follow up with results by phone. If no additional intervention is required, we will plan to see patient back in clinic in 3 months.    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  --Need to obtain DG bone met surgery to evaluate for lytic lesions.  --Will consider bone marrow biopsy based on above workup --RTC in 3 months with labs unless further intervention is required.   #Normocytic Anemia: --Patient is currently taking ferrous sulfate 325 mg daily --Patient denies any signs of bleeding and colonoscopy screens have been deferred due to age.  --Likely secondary to chronic kidney disease.  --Will evaluate other etiologies with labs to check iron and TIBC, retic panel, folate and B12 levels.   Orders Placed This Encounter  Procedures  . DG Bone Survey Met    Standing Status:   Future    Standing Expiration Date:   11/20/2021    Order Specific Question:   Reason for Exam (SYMPTOM  OR DIAGNOSIS REQUIRED)    Answer:   monoclonal gammopathy, evaluate for lytic lesions    Order Specific Question:   Preferred imaging location?    Answer:   [facility]  . CBC with Differential ([place] Only)    Standing Status:   Future    Number of Occurrences:   1    Standing Expiration Date:   11/20/2021  . CMP ([place] only)    Standing Status:   Future    Number of Occurrences:   1    Standing Expiration Date:   11/20/2021  . 24-Hr Ur UPEP/UIFE/Light Chains/TP    Standing Status:   Future    Standing Expiration Date:   11/20/2021  . Beta 2 microglobulin    Standing Status:   Future    Number of Occurrences:   1    Standing Expiration Date:   11/20/2021  . Multiple Myeloma Panel (SPEP&IFE w/QIG)    Standing Status:   Future    Number of Occurrences:   1    Standing Expiration Date:   11/20/2021  . Kappa/lambda light chains    Standing Status:   Future    Number of Occurrences:   1    Standing Expiration Date:   11/20/2021  . Lactate dehydrogenase (LDH)    Standing Status:   Future    Number of Occurrences:   1    Standing Expiration Date:   11/20/2021  . Retic Panel    Standing Status:   Future    Number of  Occurrences:   1    Standing Expiration Date:   11/20/2021  . Iron and TIBC    Standing Status:   Future    Number of Occurrences:   1    Standing Expiration Date:   11/20/2021  . Ferritin    Standing Status:   Future    Number of Occurrences:   1    Standing Expiration Date:   11/20/2021  . Folate, Serum    Standing Status:   Future    Number of Occurrences:   1    Standing Expiration Date:   11/20/2021  . Vitamin B12    Standing Status:   Future  Number of Occurrences:   1    Standing Expiration Date:   11/20/2021    All questions were answered. The patient knows to call the clinic with any problems, questions or concerns.  A total of more than 60 minutes were spent on this encounter and over half of that time was spent on counseling and coordination of care as outlined above.    Dede Query, PA-C Department of Hematology/Oncology [place] at [facility] Phone: [phone]

## 2020-11-22 LAB — BETA 2 MICROGLOBULIN, SERUM: Beta-2 Microglobulin: 3.1 mg/L — ABNORMAL HIGH (ref 0.6–2.4)

## 2020-11-23 ENCOUNTER — Telehealth: Payer: Self-pay | Admitting: Physician Assistant

## 2020-11-23 LAB — MULTIPLE MYELOMA PANEL, SERUM
Albumin SerPl Elph-Mcnc: 3.7 g/dL (ref 2.9–4.4)
Albumin/Glob SerPl: 1.1 (ref 0.7–1.7)
Alpha 1: 0.2 g/dL (ref 0.0–0.4)
Alpha2 Glob SerPl Elph-Mcnc: 0.7 g/dL (ref 0.4–1.0)
B-Globulin SerPl Elph-Mcnc: 1 g/dL (ref 0.7–1.3)
Gamma Glob SerPl Elph-Mcnc: 1.5 g/dL (ref 0.4–1.8)
Globulin, Total: 3.5 g/dL (ref 2.2–3.9)
IgA: 276 mg/dL (ref 64–422)
IgG (Immunoglobin G), Serum: 1582 mg/dL (ref 586–1602)
IgM (Immunoglobulin M), Srm: 110 mg/dL (ref 26–217)
M Protein SerPl Elph-Mcnc: 0.9 g/dL — ABNORMAL HIGH
Total Protein ELP: 7.2 g/dL (ref 6.0–8.5)

## 2020-11-23 LAB — KAPPA/LAMBDA LIGHT CHAINS
Kappa free light chain: 45.1 mg/L — ABNORMAL HIGH (ref 3.3–19.4)
Kappa, lambda light chain ratio: 1.66 — ABNORMAL HIGH (ref 0.26–1.65)
Lambda free light chains: 27.1 mg/L — ABNORMAL HIGH (ref 5.7–26.3)

## 2020-11-23 NOTE — Telephone Encounter (Signed)
Scheduled follow-up appointment per 4/22 los. Patient is aware. Calendar mailed.

## 2020-11-25 ENCOUNTER — Other Ambulatory Visit: Payer: Self-pay

## 2020-11-25 DIAGNOSIS — D472 Monoclonal gammopathy: Secondary | ICD-10-CM | POA: Diagnosis not present

## 2020-11-26 LAB — UPEP/UIFE/LIGHT CHAINS/TP, 24-HR UR
% BETA, Urine: 19.7 %
ALPHA 1 URINE: 10.5 %
Albumin, U: 56 %
Alpha 2, Urine: 9.7 %
Free Kappa Lt Chains,Ur: 6.36 mg/L (ref 1.17–86.46)
Free Kappa/Lambda Ratio: 1.01 — ABNORMAL LOW (ref 1.83–14.26)
Free Lambda Lt Chains,Ur: 6.27 mg/L (ref 0.27–15.21)
GAMMA GLOBULIN URINE: 4.1 %
Total Protein, Urine-Ur/day: 167 mg/24 hr — ABNORMAL HIGH (ref 30–150)
Total Protein, Urine: 19.6 mg/dL
Total Volume: 850

## 2020-11-30 ENCOUNTER — Telehealth: Payer: Self-pay | Admitting: Physician Assistant

## 2020-11-30 NOTE — Telephone Encounter (Signed)
R/s appts per 5/2 sch msg. Pt aware.  

## 2020-12-07 ENCOUNTER — Other Ambulatory Visit: Payer: Self-pay

## 2020-12-07 ENCOUNTER — Ambulatory Visit (HOSPITAL_COMMUNITY)
Admission: RE | Admit: 2020-12-07 | Discharge: 2020-12-07 | Disposition: A | Discharge status: Home / Self Care | Payer: Medicare Other | Source: Ambulatory Visit | Attending: Physician Assistant | Admitting: Physician Assistant

## 2020-12-07 DIAGNOSIS — D472 Monoclonal gammopathy: Secondary | ICD-10-CM | POA: Diagnosis present

## 2020-12-08 ENCOUNTER — Encounter: Payer: Self-pay | Admitting: Physician Assistant

## 2020-12-09 ENCOUNTER — Ambulatory Visit (HOSPITAL_COMMUNITY): Payer: Medicare Other

## 2020-12-10 ENCOUNTER — Telehealth: Payer: Self-pay | Admitting: Physician Assistant

## 2020-12-10 NOTE — Telephone Encounter (Signed)
I called Gloria Bowen to review results from MGUS workup. 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 Kappa/Lamda light chain ratio was mildly elevated to 1.66 in the setting of CKD. Bone survey was unremarkable for lytic lesions. Recommendation is to monitor and return in 3 months with repeat labs.   Patient expressed understanding and satisfaction with the plan provided.

## 2021-02-22 ENCOUNTER — Ambulatory Visit: Payer: Medicare Other | Admitting: Physician Assistant

## 2021-02-22 ENCOUNTER — Other Ambulatory Visit: Payer: Self-pay | Admitting: Physician Assistant

## 2021-02-22 ENCOUNTER — Other Ambulatory Visit: Payer: Medicare Other

## 2021-02-22 ENCOUNTER — Ambulatory Visit: Payer: Medicare Other | Admitting: Podiatry

## 2021-02-22 DIAGNOSIS — D472 Monoclonal gammopathy: Secondary | ICD-10-CM

## 2021-02-23 ENCOUNTER — Ambulatory Visit (HOSPITAL_COMMUNITY)
Admission: RE | Admit: 2021-02-23 | Discharge: 2021-02-23 | Disposition: A | Discharge status: Home / Self Care | Payer: Medicare Other | Source: Ambulatory Visit | Attending: Physician Assistant | Admitting: Physician Assistant

## 2021-02-23 ENCOUNTER — Other Ambulatory Visit: Payer: Self-pay

## 2021-02-23 ENCOUNTER — Inpatient Hospital Stay: Payer: Medicare Other | Attending: Physician Assistant

## 2021-02-23 ENCOUNTER — Inpatient Hospital Stay (HOSPITAL_BASED_OUTPATIENT_CLINIC_OR_DEPARTMENT_OTHER): Payer: Medicare Other | Admitting: Physician Assistant

## 2021-02-23 VITALS — BP 153/66 | HR 70 | Temp 98.5°F | Resp 17 | Ht <= 58 in | Wt 143.3 lb

## 2021-02-23 DIAGNOSIS — Z79899 Other long term (current) drug therapy: Secondary | ICD-10-CM | POA: Insufficient documentation

## 2021-02-23 DIAGNOSIS — D472 Monoclonal gammopathy: Secondary | ICD-10-CM

## 2021-02-23 DIAGNOSIS — D649 Anemia, unspecified: Secondary | ICD-10-CM | POA: Insufficient documentation

## 2021-02-23 DIAGNOSIS — R0781 Pleurodynia: Secondary | ICD-10-CM

## 2021-02-23 LAB — CBC WITH DIFFERENTIAL (CANCER CENTER ONLY)
Abs Immature Granulocytes: 0.01 10*3/uL (ref 0.00–0.07)
Basophils Absolute: 0 10*3/uL (ref 0.0–0.1)
Basophils Relative: 1 %
Eosinophils Absolute: 0.1 10*3/uL (ref 0.0–0.5)
Eosinophils Relative: 3 %
HCT: 29.8 % — ABNORMAL LOW (ref 36.0–46.0)
Hemoglobin: 9.6 g/dL — ABNORMAL LOW (ref 12.0–15.0)
Immature Granulocytes: 0 %
Lymphocytes Relative: 49 %
Lymphs Abs: 1.7 10*3/uL (ref 0.7–4.0)
MCH: 28.1 pg (ref 26.0–34.0)
MCHC: 32.2 g/dL (ref 30.0–36.0)
MCV: 87.1 fL (ref 80.0–100.0)
Monocytes Absolute: 0.5 10*3/uL (ref 0.1–1.0)
Monocytes Relative: 13 %
Neutro Abs: 1.2 10*3/uL — ABNORMAL LOW (ref 1.7–7.7)
Neutrophils Relative %: 34 %
Platelet Count: 176 10*3/uL (ref 150–400)
RBC: 3.42 MIL/uL — ABNORMAL LOW (ref 3.87–5.11)
RDW: 14 % (ref 11.5–15.5)
WBC Count: 3.5 10*3/uL — ABNORMAL LOW (ref 4.0–10.5)
nRBC: 0 % (ref 0.0–0.2)

## 2021-02-23 LAB — CMP (CANCER CENTER ONLY)
ALT: 15 U/L (ref 0–44)
AST: 21 U/L (ref 15–41)
Albumin: 3.7 g/dL (ref 3.5–5.0)
Alkaline Phosphatase: 63 U/L (ref 38–126)
Anion gap: 9 (ref 5–15)
BUN: 36 mg/dL — ABNORMAL HIGH (ref 8–23)
CO2: 26 mmol/L (ref 22–32)
Calcium: 9.5 mg/dL (ref 8.9–10.3)
Chloride: 108 mmol/L (ref 98–111)
Creatinine: 1.41 mg/dL — ABNORMAL HIGH (ref 0.44–1.00)
GFR, Estimated: 37 mL/min — ABNORMAL LOW (ref >60)
Glucose, Bld: 120 mg/dL — ABNORMAL HIGH (ref 70–99)
Potassium: 4 mmol/L (ref 3.5–5.1)
Sodium: 143 mmol/L (ref 135–145)
Total Bilirubin: 0.3 mg/dL (ref 0.3–1.2)
Total Protein: 8 g/dL (ref 6.5–8.1)

## 2021-02-23 LAB — LACTATE DEHYDROGENASE: LDH: 188 U/L (ref 98–192)

## 2021-02-23 NOTE — Progress Notes (Signed)
[place] [phone]   Fax:([phone]  [place] NOTE  Patient Care Team: Michael Boston, MD as PCP - General (Internal Medicine)   CHIEF COMPLAINTS: MGUS  HISTORY OF PRESENTING ILLNESS:  Gloria Bowen 80 y.o. 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 Patient is unaccompanied for this visit. She reports stable energy levels and appetite. She has been trying to eat a more balance diet. Patient uses a cane to ambulate. Patient denies any nausea, vomiting or abdominal pain. She has daily bowel movements without diarrhea or constipation. She denies easy bruising or signs of bleeding. 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  Patient has chronic bilateral hip pain and low back pain. Recently, patient reports left rib pain for the last two weeks. She denies recent trauma to the affected area and rates the pain as 5 out of 10 on a pain scale. Patient takes tylenol or applies topical analgesic cream with some improvement of pain.  Patient has chronic shortness of breath with exertion but none at rest. Patient denies any fevers, chills, night sweats, chest pain or cough. She has no other complaints. Rest of the 10 point ROS is below.   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    SURGICAL HISTORY: Past Surgical History:  Procedure Laterality Date   THYROIDECTOMY, PARTIAL  1991    SOCIAL HISTORY: Social History   Socioeconomic History   Marital status: Single    Spouse name: Not on file   Number of children: Not on file   Years of education: Not on file   Highest education level: Not on file  Occupational History   Not on file  Tobacco Use   Smoking status: Never   Smokeless  tobacco: Never  Substance and Sexual Activity   Alcohol use: Never   Drug use: Never   Sexual activity: Not on file  Other Topics Concern   Not on file  Social History Narrative   ** Merged History Encounter **       Social Determinants of Health   Financial Resource Strain: Not on file  Food Insecurity: Not on file  Transportation Needs: Not on file  Physical Activity: Not on file  Stress: Not on file  Social Connections: Not on file  Intimate Partner Violence: Not on file    FAMILY HISTORY: Family History  Problem Relation Age of Onset   Breast cancer Sister    Lung cancer Mother        smokeless tobacco use.     ALLERGIES:  is allergic to nabumetone and ramipril.  MEDICATIONS:  Current Outpatient Medications  Medication Sig Dispense Refill   amLODipine (NORVASC) 10 MG tablet Take 10 mg by mouth daily.     aspirin 81 MG tablet Take 81 mg by mouth daily.     b complex vitamins capsule Take 1 capsule by mouth daily.     doxazosin (CARDURA) 4 MG tablet Take 4 mg by mouth daily.     ferrous sulfate 325 (65 FE) MG tablet Take 325 mg by mouth daily with breakfast.     furosemide (LASIX) 20 MG tablet      levothyroxine (SYNTHROID) 88 MCG tablet  losartan (COZAAR) 100 MG tablet Take 100 mg by mouth daily.     metoprolol succinate (TOPROL-XL) 100 MG 24 hr tablet Take 100 mg by mouth daily.     Potassium Chloride ER 20 MEQ TBCR Take 1 tablet by mouth daily.     WIXELA INHUB 250-50 MCG/DOSE AEPB Inhale 1 puff into the lungs 2 (two) times daily.     No current facility-administered medications for this visit.    REVIEW OF SYSTEMS:   Constitutional: ( - ) fevers, ( - )  chills , ( - ) night sweats Eyes: ( - ) blurriness of vision, ( - ) double vision, ( - ) watery eyes Ears, nose, mouth, throat, and face: ( - ) mucositis, ( - ) sore throat Respiratory: ( - ) cough, ( - ) dyspnea, ( - ) wheezes Cardiovascular: ( - ) palpitation, ( - ) chest discomfort, ( - ) lower  extremity swelling Gastrointestinal:  ( - ) nausea, ( - ) heartburn, ( - ) change in bowel habits Skin: ( - ) abnormal skin rashes Lymphatics: ( - ) new lymphadenopathy, ( - ) easy bruising Neurological: ( + ) numbness, ( - ) tingling, ( - ) new weaknesses Behavioral/Psych: ( - ) mood change, ( - ) new changes  All other systems were reviewed with the patient and are negative.  PHYSICAL EXAMINATION: ECOG PERFORMANCE STATUS: 0 - Asymptomatic  Vitals:   02/23/21 1058  BP: (!) 153/66  Pulse: 70  Resp: 17  Temp: 98.5 F (36.9 C)  SpO2: 100%   Filed Weights   02/23/21 1058  Weight: 143 lb 4.8 oz (65 kg)    GENERAL: well appearing African American female in NAD  SKIN: skin color, texture, turgor are normal, no rashes or significant lesions EYES: conjunctiva are pink and non-injected, sclera clear OROPHARYNX: no exudate, no erythema; lips, buccal mucosa, and tongue normal  NECK: supple, non-tenderLYMPH:  no palpable lymphadenopathy in the cervical, axillary or supraclavicular lymph nodes.  LUNGS: clear to auscultation and percussion with normal breathing effort HEART: regular rate & rhythm and no murmurs and no lower extremity edema ABDOMEN: soft, non-tender, non-distended, normal bowel sounds Musculoskeletal: no cyanosis of digits and no clubbing. Tenderness to palpation at left lateral rib, no palpable mass.   PSYCH: alert & oriented x 3, fluent speech NEURO: no focal motor/sensory deficits  LABORATORY DATA:  I have reviewed the data as listed CBC Latest Ref Rng & Units 02/23/2021 11/20/2020  WBC 4.0 - 10.5 K/uL 3.5(L) 3.3(L)  Hemoglobin 12.0 - 15.0 g/dL 9.6(L) 10.2(L)  Hematocrit 36.0 - 46.0 % 29.8(L) 31.3(L)  Platelets 150 - 400 K/uL 176 181    CMP Latest Ref Rng & Units 02/23/2021 11/20/2020  Glucose 70 - 99 mg/dL 120(H) 96  BUN 8 - 23 mg/dL 36(H) 29(H)  Creatinine 0.44 - 1.00 mg/dL 1.41(H) 1.36(H)  Sodium 135 - 145 mmol/L 143 142  Potassium 3.5 - 5.1 mmol/L 4.0 3.9   Chloride 98 - 111 mmol/L 108 106  CO2 22 - 32 mmol/L 26 26  Calcium 8.9 - 10.3 mg/dL 9.5 9.6  Total Protein 6.5 - 8.1 g/dL 8.0 8.0  Total Bilirubin 0.3 - 1.2 mg/dL 0.3 0.5  Alkaline Phos 38 - 126 U/L 63 61  AST 15 - 41 U/L 21 25  ALT 0 - 44 U/L 15 17    RADIOGRAPHIC STUDIES: I have personally reviewed the radiological images as listed and agreed with the findings in the report. DG Ribs  Unilateral W/Chest Left  Result Date: 02/23/2021 CLINICAL DATA:  Rib pain on left side.  No known injury. EXAM: LEFT RIBS AND CHEST - 3+ VIEW COMPARISON:  06/16/2016 FINDINGS: No fracture or other bone lesions are seen involving the ribs. There is no evidence of pneumothorax or pleural effusion. Both lungs are clear. Heart size and mediastinal contours are within normal limits. IMPRESSION: Negative. Electronically Signed   By: Rolm Baptise M.D.   On: 02/23/2021 12:15     ASSESSMENT & PLAN [address] is a 80 y.o. female presenting to the clinic for routine follow up for MGUS.   #MGUS: --Initial labs are from 11/20/2020. SPEP revealed M protein 0.9. IFE showed IgG monoclonal protein with kappa light chain specificity. Kappa/lambda light chain ration was elevated at 1.66.  --DG bone met survey from 12/08/2020 showed no evidence of lytic lesions.  --SPEP with IFE and serum free light chains from today are pending.  --RTC in 3 months with labs unless pending labs from today require further intervention.  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  --Patient is currently taking ferrous sulfate 325 mg daily as prescribed by PCP.  --Patient denies any signs of bleeding and colonoscopy screens have been deferred due to age. --B12, Folate and Iron were checked on 11/20/2020, all unremarkable except for iron saturation on the low end of normal at 22% and ferritin elevated at 509.  --CBC from today shows anemia has slightly worsened to 9.6.   #Neutropenia: --Mild and stable. CBC from  today shows ANC is 1.2.  --Monitor for now.   #Left rib pain: --Rib xray from today was unremarkable.  --Advised patient to follow up with PCP if there is persistent pain.    Orders Placed This Encounter  Procedures   DG Ribs Unilateral W/Chest Left    Standing Status:   Future    Number of Occurrences:   1    Standing Expiration Date:   02/23/2022    Order Specific Question:   Reason for Exam (SYMPTOM  OR DIAGNOSIS REQUIRED)    Answer:   left rib pain x 2 weeks, no trauma or injury    Order Specific Question:   Preferred imaging location?    Answer:   [facility]    All questions were answered. The patient knows to call the clinic with any problems, questions or concerns.  I have spent a total of 25 minutes minutes of face-to-face and non-face-to-face time, preparing to see the patient, obtaining and/or reviewing separately obtained history, performing a medically appropriate examination, counseling and educating the patient, ordering tests,  documenting clinical information in the electronic health record, and care coordination.     Dede Query, PA-C Department of Hematology/Oncology [place] at [facility] Phone: [phone]

## 2021-02-24 ENCOUNTER — Ambulatory Visit (INDEPENDENT_AMBULATORY_CARE_PROVIDER_SITE_OTHER): Payer: Medicare Other | Admitting: Podiatry

## 2021-02-24 DIAGNOSIS — M79675 Pain in left toe(s): Secondary | ICD-10-CM

## 2021-02-24 DIAGNOSIS — M79674 Pain in right toe(s): Secondary | ICD-10-CM

## 2021-02-24 DIAGNOSIS — B351 Tinea unguium: Secondary | ICD-10-CM

## 2021-02-24 LAB — KAPPA/LAMBDA LIGHT CHAINS
Kappa free light chain: 57.8 mg/L — ABNORMAL HIGH (ref 3.3–19.4)
Kappa, lambda light chain ratio: 1.79 — ABNORMAL HIGH (ref 0.26–1.65)
Lambda free light chains: 32.3 mg/L — ABNORMAL HIGH (ref 5.7–26.3)

## 2021-02-24 LAB — BETA 2 MICROGLOBULIN, SERUM: Beta-2 Microglobulin: 3.1 mg/L — ABNORMAL HIGH (ref 0.6–2.4)

## 2021-02-25 DIAGNOSIS — R0781 Pleurodynia: Secondary | ICD-10-CM | POA: Insufficient documentation

## 2021-02-27 ENCOUNTER — Encounter: Payer: Self-pay | Admitting: Podiatry

## 2021-02-27 DIAGNOSIS — N393 Stress incontinence (female) (male): Secondary | ICD-10-CM | POA: Insufficient documentation

## 2021-02-27 LAB — MULTIPLE MYELOMA PANEL, SERUM
Albumin SerPl Elph-Mcnc: 3.8 g/dL (ref 2.9–4.4)
Albumin/Glob SerPl: 1.2 (ref 0.7–1.7)
Alpha 1: 0.2 g/dL (ref 0.0–0.4)
Alpha2 Glob SerPl Elph-Mcnc: 0.7 g/dL (ref 0.4–1.0)
B-Globulin SerPl Elph-Mcnc: 0.9 g/dL (ref 0.7–1.3)
Gamma Glob SerPl Elph-Mcnc: 1.6 g/dL (ref 0.4–1.8)
Globulin, Total: 3.4 g/dL (ref 2.2–3.9)
IgA: 251 mg/dL (ref 64–422)
IgG (Immunoglobin G), Serum: 1851 mg/dL — ABNORMAL HIGH (ref 586–1602)
IgM (Immunoglobulin M), Srm: 110 mg/dL (ref 26–217)
M Protein SerPl Elph-Mcnc: 0.6 g/dL — ABNORMAL HIGH
Total Protein ELP: 7.2 g/dL (ref 6.0–8.5)

## 2021-02-27 NOTE — Progress Notes (Signed)
  Subjective:  Patient ID: Gloria Bowen, female    DOB: September 20, 1940,  MRN: [id number]  Gloria Bowen presents to clinic today for preventative diabetic foot care and painful thick toenails that are difficult to trim. Pain interferes with ambulation. Aggravating factors include wearing enclosed shoe gear. Pain is relieved with periodic professional debridement.  Patient does not monitor blood glucose daily.  PCP is Michael Boston, MD , and last visit was 10/21/2020.  Allergies  Allergen Reactions   Nabumetone Other (See Comments)   Ramipril Other (See Comments)    Review of Systems: Negative except as noted in the HPI. Objective:   Constitutional Gloria Bowen is a pleasant 80 y.o. African American female, in NAD. AAO x 3.   Vascular Capillary refill time to digits immediate b/l. Palpable DP pulse(s) right lower extremity Palpable PT pulse(s) right lower extremity Nonpalpable DP pulse(s) left lower extremity. Nonpalpable PT pulse(s) left lower extremity. Pedal hair present. Lower extremity skin temperature gradient within normal limits. No cyanosis or clubbing noted.  Neurologic Normal speech. Oriented to person, place, and time. Protective sensation intact 5/5 intact bilaterally with 10g monofilament b/l.  Dermatologic Pedal skin with normal turgor, texture and tone b/l lower extremities. Toenails 1-5 b/l elongated, discolored, dystrophic, thickened, crumbly with subungual debris and tenderness to dorsal palpation.  Orthopedic: Normal muscle strength 5/5 to all lower extremity muscle groups bilaterally. 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 Hammertoe(s) noted to the L 2nd toe, L 3rd toe, R 2nd toe, and R 3rd toe.   Radiographs: None Assessment:   1. Pain due to onychomycosis of toenails of both feet    Plan:  -Examined patient. -Continue diabetic foot care principles. -Patient to continue soft, supportive shoe gear daily. -Toenails 1-5 b/l  were debrided in length and girth with sterile nail nippers and dremel without iatrogenic bleeding.  -Patient to report any pedal injuries to medical professional immediately. -Patient/POA to call should there be question/concern in the interim.  Return in about 3 months (around 05/27/2021).  Marzetta Board, DPM

## 2021-05-24 ENCOUNTER — Other Ambulatory Visit: Payer: Self-pay | Admitting: Physician Assistant

## 2021-05-24 DIAGNOSIS — D472 Monoclonal gammopathy: Secondary | ICD-10-CM

## 2021-05-24 DIAGNOSIS — D649 Anemia, unspecified: Secondary | ICD-10-CM

## 2021-05-25 ENCOUNTER — Inpatient Hospital Stay: Payer: Medicare Other | Attending: Physician Assistant | Admitting: Physician Assistant

## 2021-05-25 ENCOUNTER — Other Ambulatory Visit: Payer: Self-pay

## 2021-05-25 ENCOUNTER — Inpatient Hospital Stay: Payer: Medicare Other

## 2021-05-25 VITALS — BP 145/69 | HR 63 | Temp 97.9°F | Resp 17 | Ht <= 58 in | Wt 147.6 lb

## 2021-05-25 DIAGNOSIS — D649 Anemia, unspecified: Secondary | ICD-10-CM | POA: Diagnosis not present

## 2021-05-25 DIAGNOSIS — D472 Monoclonal gammopathy: Secondary | ICD-10-CM | POA: Insufficient documentation

## 2021-05-25 DIAGNOSIS — M25552 Pain in left hip: Secondary | ICD-10-CM | POA: Insufficient documentation

## 2021-05-25 DIAGNOSIS — Z801 Family history of malignant neoplasm of trachea, bronchus and lung: Secondary | ICD-10-CM | POA: Diagnosis not present

## 2021-05-25 DIAGNOSIS — R0602 Shortness of breath: Secondary | ICD-10-CM | POA: Diagnosis not present

## 2021-05-25 DIAGNOSIS — M545 Low back pain, unspecified: Secondary | ICD-10-CM | POA: Insufficient documentation

## 2021-05-25 DIAGNOSIS — M25551 Pain in right hip: Secondary | ICD-10-CM | POA: Insufficient documentation

## 2021-05-25 DIAGNOSIS — Z803 Family history of malignant neoplasm of breast: Secondary | ICD-10-CM | POA: Diagnosis not present

## 2021-05-25 DIAGNOSIS — D709 Neutropenia, unspecified: Secondary | ICD-10-CM | POA: Diagnosis not present

## 2021-05-25 DIAGNOSIS — N183 Chronic kidney disease, stage 3 unspecified: Secondary | ICD-10-CM | POA: Diagnosis not present

## 2021-05-25 DIAGNOSIS — G629 Polyneuropathy, unspecified: Secondary | ICD-10-CM | POA: Insufficient documentation

## 2021-05-25 LAB — CMP (CANCER CENTER ONLY)
ALT: 24 U/L (ref 0–44)
AST: 26 U/L (ref 15–41)
Albumin: 3.7 g/dL (ref 3.5–5.0)
Alkaline Phosphatase: 60 U/L (ref 38–126)
Anion gap: 9 (ref 5–15)
BUN: 29 mg/dL — ABNORMAL HIGH (ref 8–23)
CO2: 25 mmol/L (ref 22–32)
Calcium: 9.2 mg/dL (ref 8.9–10.3)
Chloride: 107 mmol/L (ref 98–111)
Creatinine: 1.33 mg/dL — ABNORMAL HIGH (ref 0.44–1.00)
GFR, Estimated: 40 mL/min — ABNORMAL LOW (ref >60)
Glucose, Bld: 127 mg/dL — ABNORMAL HIGH (ref 70–99)
Potassium: 3.8 mmol/L (ref 3.5–5.1)
Sodium: 141 mmol/L (ref 135–145)
Total Bilirubin: 0.4 mg/dL (ref 0.3–1.2)
Total Protein: 7.4 g/dL (ref 6.5–8.1)

## 2021-05-25 LAB — CBC WITH DIFFERENTIAL (CANCER CENTER ONLY)
Abs Immature Granulocytes: 0 10*3/uL (ref 0.00–0.07)
Basophils Absolute: 0 10*3/uL (ref 0.0–0.1)
Basophils Relative: 1 %
Eosinophils Absolute: 0.1 10*3/uL (ref 0.0–0.5)
Eosinophils Relative: 3 %
HCT: 29.9 % — ABNORMAL LOW (ref 36.0–46.0)
Hemoglobin: 9.8 g/dL — ABNORMAL LOW (ref 12.0–15.0)
Immature Granulocytes: 0 %
Lymphocytes Relative: 49 %
Lymphs Abs: 1.7 10*3/uL (ref 0.7–4.0)
MCH: 28.1 pg (ref 26.0–34.0)
MCHC: 32.8 g/dL (ref 30.0–36.0)
MCV: 85.7 fL (ref 80.0–100.0)
Monocytes Absolute: 0.4 10*3/uL (ref 0.1–1.0)
Monocytes Relative: 11 %
Neutro Abs: 1.2 10*3/uL — ABNORMAL LOW (ref 1.7–7.7)
Neutrophils Relative %: 36 %
Platelet Count: 181 10*3/uL (ref 150–400)
RBC: 3.49 MIL/uL — ABNORMAL LOW (ref 3.87–5.11)
RDW: 13.6 % (ref 11.5–15.5)
WBC Count: 3.4 10*3/uL — ABNORMAL LOW (ref 4.0–10.5)
nRBC: 0 % (ref 0.0–0.2)

## 2021-05-25 LAB — SAVE SMEAR(SSMR), FOR PROVIDER SLIDE REVIEW

## 2021-05-25 NOTE — Progress Notes (Signed)
[place] [phone]   Fax:(336) [phone]  [place] NOTE  Patient Care Team: Michael Boston, MD as PCP - General (Internal Medicine)   CHIEF COMPLAINTS: MGUS  HISTORY OF PRESENTING ILLNESS:  Gloria Bowen 80 y.o. 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 She  is unaccompanied for this visit.  She continues to do well without any significant limitations.  She ambulates independently but uses a cane for assistance.  She denies any changes to her appetite or weight.  She denies any nausea, vomiting or abdominal pain.  Her bowel movements are fairly unchanged.  She denies easy bruising or signs of bleeding.  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  She does have issues with grip and balance due to neuropathy.  She recently stubbed her toe on the side of her bed earlier today that is causing her some pain.  She has chronic bilateral hip pain and low back pain. Patient has chronic shortness of breath with exertion but none at rest. Patient denies any fevers, chills, night sweats, chest pain or cough. She has no other complaints. Rest of the 10 point ROS is below.   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    SURGICAL HISTORY: Past Surgical History:  Procedure Laterality Date   THYROIDECTOMY, PARTIAL  1991    SOCIAL HISTORY: Social History   Socioeconomic History   Marital status: Single    Spouse name: Not on file   Number of children: Not on file   Years of education: Not on file   Highest education level: Not on file  Occupational History   Not on file  Tobacco Use   Smoking status: Never   Smokeless tobacco: Never  Substance and Sexual Activity   Alcohol use: Never   Drug use: Never   Sexual  activity: Not on file  Other Topics Concern   Not on file  Social History Narrative   ** Merged History Encounter **       Social Determinants of Health   Financial Resource Strain: Not on file  Food Insecurity: Not on file  Transportation Needs: Not on file  Physical Activity: Not on file  Stress: Not on file  Social Connections: Not on file  Intimate Partner Violence: Not on file    FAMILY HISTORY: Family History  Problem Relation Age of Onset   Breast cancer Sister    Lung cancer Mother        smokeless tobacco use.     ALLERGIES:  is allergic to nabumetone and ramipril.  MEDICATIONS:  Current Outpatient Medications  Medication Sig Dispense Refill   amLODipine (NORVASC) 10 MG tablet Take 10 mg by mouth Bowen.     aspirin 81 MG tablet Take 81 mg by mouth Bowen.     b complex vitamins capsule Take 1 capsule by mouth Bowen.     doxazosin (CARDURA) 4 MG tablet Take 4 mg by mouth Bowen.     ferrous sulfate 325 (65 FE) MG tablet Take 325 mg by mouth Bowen with breakfast.     furosemide (LASIX) 20 MG tablet      levothyroxine (SYNTHROID) 88 MCG tablet      losartan (COZAAR) 100 MG tablet Take  100 mg by mouth Bowen.     metoprolol succinate (TOPROL-XL) 100 MG 24 hr tablet Take 100 mg by mouth Bowen.     Potassium Chloride ER 20 MEQ TBCR Take 1 tablet by mouth Bowen.     WIXELA INHUB 250-50 MCG/DOSE AEPB Inhale 1 puff into the lungs Bowen.     No current facility-administered medications for this visit.    REVIEW OF SYSTEMS:   Constitutional: ( - ) fevers, ( - )  chills , ( - ) night sweats Eyes: ( - ) blurriness of vision, ( - ) double vision, ( - ) watery eyes Ears, nose, mouth, throat, and face: ( - ) mucositis, ( - ) sore throat Respiratory: ( - ) cough, ( - ) dyspnea, ( - ) wheezes Cardiovascular: ( - ) palpitation, ( - ) chest discomfort, ( - ) lower extremity swelling Gastrointestinal:  ( - ) nausea, ( - ) heartburn, ( - ) change in bowel habits Skin: ( - )  abnormal skin rashes Lymphatics: ( - ) new lymphadenopathy, ( - ) easy bruising Neurological: ( + ) numbness, ( - ) tingling, ( - ) new weaknesses Behavioral/Psych: ( - ) mood change, ( - ) new changes  All other systems were reviewed with the patient and are negative.  PHYSICAL EXAMINATION: ECOG PERFORMANCE STATUS: 0 - Asymptomatic  Vitals:   05/25/21 1028  BP: (!) 145/69  Pulse: 63  Resp: 17  Temp: 97.9 F (36.6 C)  SpO2: 100%   Filed Weights   05/25/21 1028  Weight: 147 lb 9.6 oz (67 kg)    GENERAL: well appearing African American female in NAD  SKIN: skin color, texture, turgor are normal, no rashes or significant lesions EYES: conjunctiva are pink and non-injected, sclera clear OROPHARYNX: no exudate, no erythema; lips, buccal mucosa, and tongue normal  NECK: supple, non-tenderLYMPH:  no palpable lymphadenopathy in the cervical, axillary or supraclavicular lymph nodes.  LUNGS: clear to auscultation and percussion with normal breathing effort HEART: regular rate & rhythm and no murmurs and no lower extremity edema ABDOMEN: soft, non-tender, non-distended, normal bowel sounds Musculoskeletal: no cyanosis of digits and no clubbing.  PSYCH: alert & oriented x 3, fluent speech NEURO: no focal motor/sensory deficits  LABORATORY DATA:  I have reviewed the data as listed CBC Latest Ref Rng & Units 05/25/2021 02/23/2021 11/20/2020  WBC 4.0 - 10.5 K/uL 3.4(L) 3.5(L) 3.3(L)  Hemoglobin 12.0 - 15.0 g/dL 9.8(L) 9.6(L) 10.2(L)  Hematocrit 36.0 - 46.0 % 29.9(L) 29.8(L) 31.3(L)  Platelets 150 - 400 K/uL 181 176 181    CMP Latest Ref Rng & Units 05/25/2021 02/23/2021 11/20/2020  Glucose 70 - 99 mg/dL 127(H) 120(H) 96  BUN 8 - 23 mg/dL 29(H) 36(H) 29(H)  Creatinine 0.44 - 1.00 mg/dL 1.33(H) 1.41(H) 1.36(H)  Sodium 135 - 145 mmol/L 141 143 142  Potassium 3.5 - 5.1 mmol/L 3.8 4.0 3.9  Chloride 98 - 111 mmol/L 107 108 106  CO2 22 - 32 mmol/L '25 26 26  ' Calcium 8.9 - 10.3 mg/dL 9.2 9.5  9.6  Total Protein 6.5 - 8.1 g/dL 7.4 8.0 8.0  Total Bilirubin 0.3 - 1.2 mg/dL 0.4 0.3 0.5  Alkaline Phos 38 - 126 U/L 60 63 61  AST 15 - 41 U/L '26 21 25  ' ALT 0 - 44 U/L '24 15 17     ' ASSESSMENT & PLAN Gloria Bowen is a 80 y.o. female presenting to the clinic for routine follow up for MGUS.   #  MGUS: --DG bone met survey from 12/08/2020 showed no evidence of lytic lesions. Next one due in May 2023.  --CBC shows stable anemia and neutropenia. CMP shows stable creatinine level at 1.33. No evidence of hypercalcemia.  --SPEP with IFE and serum free light chains from today are pending.  --RTC in 6 months with labs unless pending labs from today require further intervention.  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  --Patient is currently taking ferrous sulfate 325 mg Bowen as prescribed by PCP.  --Patient denies any signs of bleeding and colonoscopy screens have been deferred due to age. --B12, Folate and Iron were checked on 11/20/2020, all unremarkable except for iron saturation on the low end of normal at 22% and ferritin elevated at 509.  --CBC from today shows stable anemia with hgb of 9.8. Checking copper, erythropoietin, and save smear to rule out other causes.  #Neutropenia: --Mild and stable. CBC from today shows ANC is 1.2.  --Monitor for now.    No orders of the defined types were placed in this encounter.   All questions were answered. The patient knows to call the clinic with any problems, questions or concerns.  I have spent a total of 25 minutes minutes of face-to-face and non-face-to-face time, preparing to see the patient, obtaining and/or reviewing separately obtained history, performing a medically appropriate examination, counseling and educating the patient, ordering tests,  documenting clinical information in the electronic health record, and care coordination.     Dede Query, PA-C Department of Hematology/Oncology [place] at [facility] Phone: [phone]

## 2021-05-26 LAB — KAPPA/LAMBDA LIGHT CHAINS
Kappa free light chain: 43 mg/L — ABNORMAL HIGH (ref 3.3–19.4)
Kappa, lambda light chain ratio: 1.78 — ABNORMAL HIGH (ref 0.26–1.65)
Lambda free light chains: 24.1 mg/L (ref 5.7–26.3)

## 2021-05-26 LAB — ERYTHROPOIETIN: Erythropoietin: 11.6 m[IU]/mL (ref 2.6–18.5)

## 2021-05-27 LAB — MULTIPLE MYELOMA PANEL, SERUM
Albumin SerPl Elph-Mcnc: 3.5 g/dL (ref 2.9–4.4)
Albumin/Glob SerPl: 1.1 (ref 0.7–1.7)
Alpha 1: 0.2 g/dL (ref 0.0–0.4)
Alpha2 Glob SerPl Elph-Mcnc: 0.7 g/dL (ref 0.4–1.0)
B-Globulin SerPl Elph-Mcnc: 0.9 g/dL (ref 0.7–1.3)
Gamma Glob SerPl Elph-Mcnc: 1.5 g/dL (ref 0.4–1.8)
Globulin, Total: 3.3 g/dL (ref 2.2–3.9)
IgA: 264 mg/dL (ref 64–422)
IgG (Immunoglobin G), Serum: 1655 mg/dL — ABNORMAL HIGH (ref 586–1602)
IgM (Immunoglobulin M), Srm: 118 mg/dL (ref 26–217)
M Protein SerPl Elph-Mcnc: 0.7 g/dL — ABNORMAL HIGH
Total Protein ELP: 6.8 g/dL (ref 6.0–8.5)

## 2021-05-27 LAB — COPPER, SERUM: Copper: 91 ug/dL (ref 80–158)

## 2021-06-01 ENCOUNTER — Inpatient Hospital Stay: Payer: Medicare Other | Attending: Physician Assistant

## 2021-06-01 ENCOUNTER — Other Ambulatory Visit: Payer: Self-pay

## 2021-06-01 DIAGNOSIS — D709 Neutropenia, unspecified: Secondary | ICD-10-CM | POA: Insufficient documentation

## 2021-06-01 DIAGNOSIS — N183 Chronic kidney disease, stage 3 unspecified: Secondary | ICD-10-CM | POA: Insufficient documentation

## 2021-06-01 DIAGNOSIS — D649 Anemia, unspecified: Secondary | ICD-10-CM | POA: Diagnosis not present

## 2021-06-01 DIAGNOSIS — M545 Low back pain, unspecified: Secondary | ICD-10-CM | POA: Insufficient documentation

## 2021-06-01 DIAGNOSIS — D472 Monoclonal gammopathy: Secondary | ICD-10-CM

## 2021-06-01 DIAGNOSIS — Z79899 Other long term (current) drug therapy: Secondary | ICD-10-CM | POA: Insufficient documentation

## 2021-06-01 DIAGNOSIS — M25551 Pain in right hip: Secondary | ICD-10-CM | POA: Insufficient documentation

## 2021-06-01 DIAGNOSIS — R0602 Shortness of breath: Secondary | ICD-10-CM | POA: Diagnosis not present

## 2021-06-01 DIAGNOSIS — G629 Polyneuropathy, unspecified: Secondary | ICD-10-CM | POA: Insufficient documentation

## 2021-06-01 DIAGNOSIS — M25552 Pain in left hip: Secondary | ICD-10-CM | POA: Insufficient documentation

## 2021-06-02 ENCOUNTER — Telehealth: Payer: Self-pay | Admitting: *Deleted

## 2021-06-02 ENCOUNTER — Other Ambulatory Visit: Payer: Self-pay

## 2021-06-02 ENCOUNTER — Ambulatory Visit (INDEPENDENT_AMBULATORY_CARE_PROVIDER_SITE_OTHER): Payer: Medicare Other | Admitting: Podiatry

## 2021-06-02 DIAGNOSIS — M79674 Pain in right toe(s): Secondary | ICD-10-CM

## 2021-06-02 DIAGNOSIS — E119 Type 2 diabetes mellitus without complications: Secondary | ICD-10-CM | POA: Diagnosis not present

## 2021-06-02 DIAGNOSIS — M79675 Pain in left toe(s): Secondary | ICD-10-CM

## 2021-06-02 DIAGNOSIS — D229 Melanocytic nevi, unspecified: Secondary | ICD-10-CM

## 2021-06-02 DIAGNOSIS — B351 Tinea unguium: Secondary | ICD-10-CM

## 2021-06-02 NOTE — Progress Notes (Signed)
Subjective: Gloria Bowen is a 80 y.o. female patient seen today for follow up of  painful thick toenails that are difficult to trim. Pain interferes with ambulation. Aggravating factors include wearing enclosed shoe gear. Pain is relieved with periodic professional debridement.  Sadly, she recently lost her daughter.  New problems reported today: None.  Patient is diabetic and does not routinely monitor her blood glucose. Sje watches her diet.  PCP is Michael Boston, MD. Last visit was: October, 2022.  Allergies  Allergen Reactions   Nabumetone Other (See Comments)   Ramipril Other (See Comments)    Objective: Physical Exam  General: Patient is a pleasant 80 y.o. African American female WD, WN in NAD. AAO x 3.   Neurovascular Examination: Capillary refill time to digits immediate b/l. Palpable DP pulse(s) right lower extremity Palpable PT pulse(s) right lower extremity Nonpalpable DP pulse(s) left lower extremity. Nonpalpable PT pulse(s) left lower extremity. No pain with calf compression b/l. Lower extremity skin temperature gradient within normal limits.  Protective sensation intact 5/5 intact bilaterally with 10g monofilament b/l.  Dermatological:  Pedal integument with normal turgor, texture and tone b/l LE. No open wounds b/l. No interdigital macerations b/l. Toenails 1-5 b/l elongated, thickened, discolored with subungual debris. +Tenderness with dorsal palpation of nailplates. No hyperkeratotic or porokeratotic lesions present. Toenails 1-5 b/l elongated, discolored, dystrophic, thickened, crumbly with subungual debris and tenderness to dorsal palpation.  Small macule noted at distal tip of left 2nd toe. There is also a linear meleanotic area subungually, but does not go beyond proximal nailfold.  Musculoskeletal:  Normal muscle strength 5/5 to all lower extremity muscle groups bilaterally. HAV with bunion deformity noted b/l LE. Hammertoe(s) noted to the L 2nd toe, L 3rd  toe, R 2nd toe, and R 3rd toe.  Assessment: 1. Pain due to onychomycosis of toenails of both feet   2. Suspicious nevus   3. Diabetes mellitus without complication ([place])    Plan: Patient was evaluated and treated and all questions answered. Consent given for treatment as described below: -No new findings. No new orders. -For suspicious nevus, referral to Dermatology for evaluation of left 2nd digit. -Continue diabetic foot care principles: inspect feet daily, monitor glucose as recommended by PCP and/or Endocrinologist, and follow prescribed diet per PCP, Endocrinologist and/or dietician. -Toenails 1-5 b/l were debrided in length and girth with sterile nail nippers and dremel without iatrogenic bleeding.  -Patient/POA to call should there be question/concern in the interim.  Return in about 3 months (around 09/02/2021).  Marzetta Board, DPM

## 2021-06-02 NOTE — Telephone Encounter (Signed)
Notified of message below

## 2021-06-02 NOTE — Telephone Encounter (Signed)
-----   Message from Shawn Stall, RN sent at 06/02/2021 11:00 AM EDT -----  ----- Message ----- From: Cordelia Poche Sent: 05/28/2021   4:09 PM EDT To: Shawn Stall, RN  Tried to call patient a couple of times today, not able to connect. Since I'm out next week, please let patient know that her levels look stable and we will plan to see her back in 6 months as planned.

## 2021-06-03 LAB — UPEP/UIFE/LIGHT CHAINS/TP, 24-HR UR
% BETA, Urine: 0 %
ALPHA 1 URINE: 0 %
Albumin, U: 100 %
Alpha 2, Urine: 0 %
Free Kappa Lt Chains,Ur: 28.65 mg/L (ref 1.17–86.46)
Free Kappa/Lambda Ratio: 4.28 (ref 1.83–14.26)
Free Lambda Lt Chains,Ur: 6.69 mg/L (ref 0.27–15.21)
GAMMA GLOBULIN URINE: 0 %
Total Protein, Urine-Ur/day: 308 mg/24 hr — ABNORMAL HIGH (ref 30–150)
Total Protein, Urine: 26.8 mg/dL
Total Volume: 1150

## 2021-06-05 ENCOUNTER — Encounter: Payer: Self-pay | Admitting: Podiatry

## 2021-09-08 IMAGING — DX DG BONE SURVEY MET
9 of 10 series · 9 of 10 positions shown · non-contrast
Comparison: Chest radiograph June 16, 2016 and MRI lumbar spine
May 18, 2020

CLINICAL DATA: Monoclonal gammopathy, evaluate for lytic lesions.

EXAM:
METASTATIC BONE SURVEY

[skull lat]
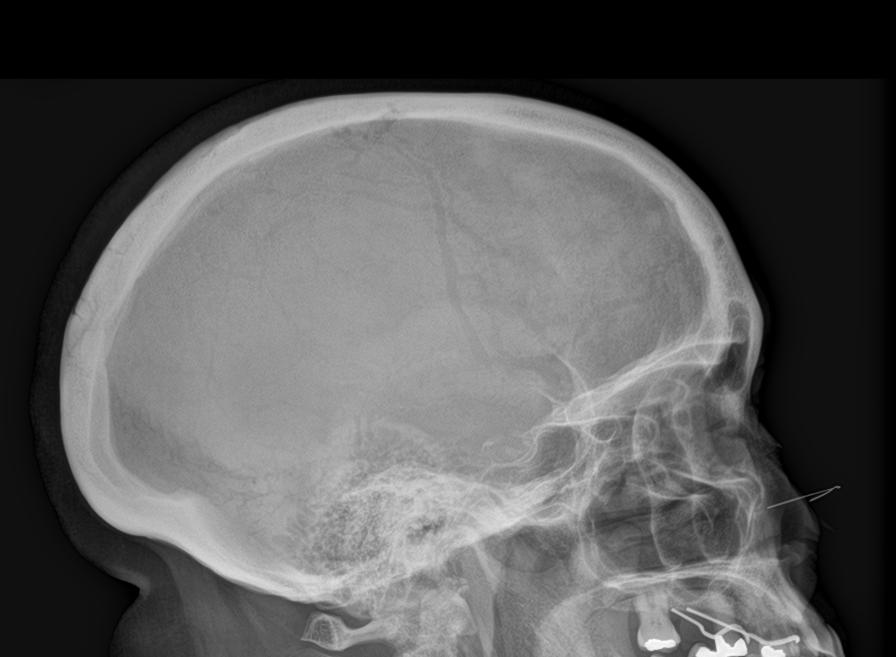

[shoulder ap (1 of 2)]
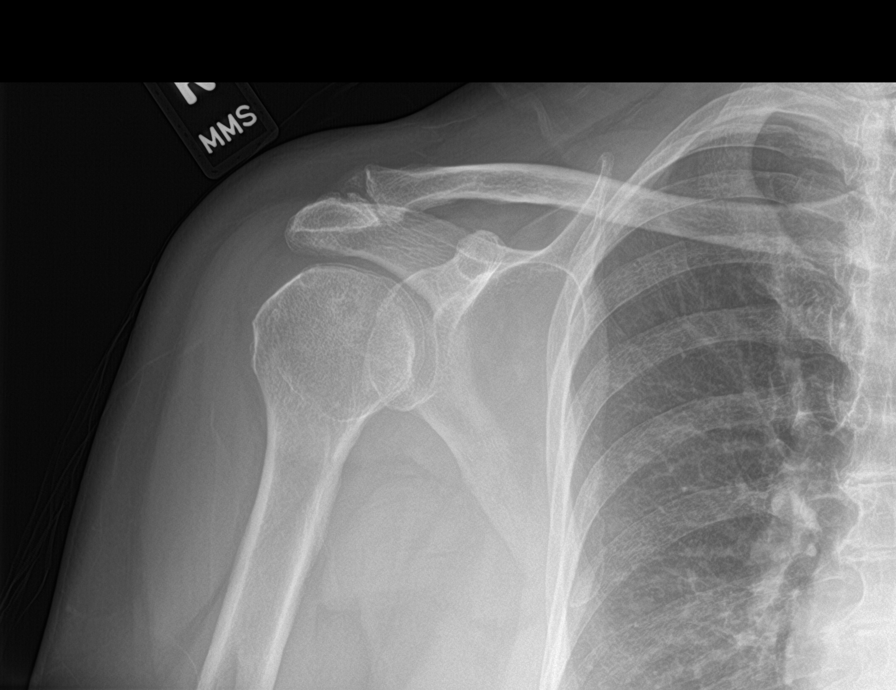

[shoulder ap (2 of 2)]
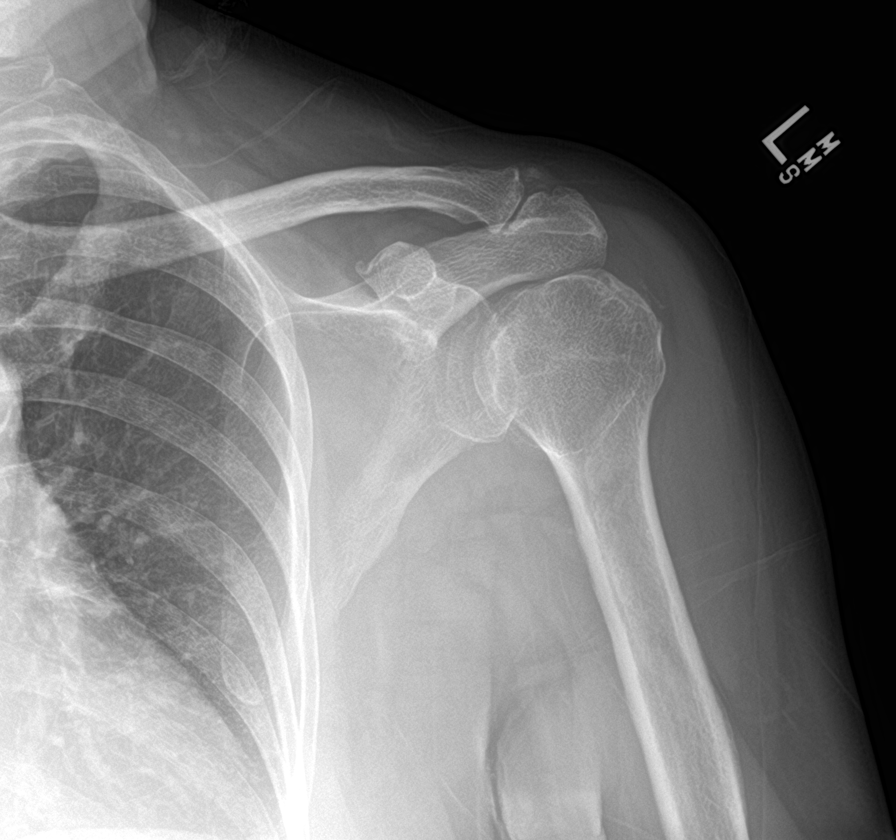

[humerus ap (1 of 2)]
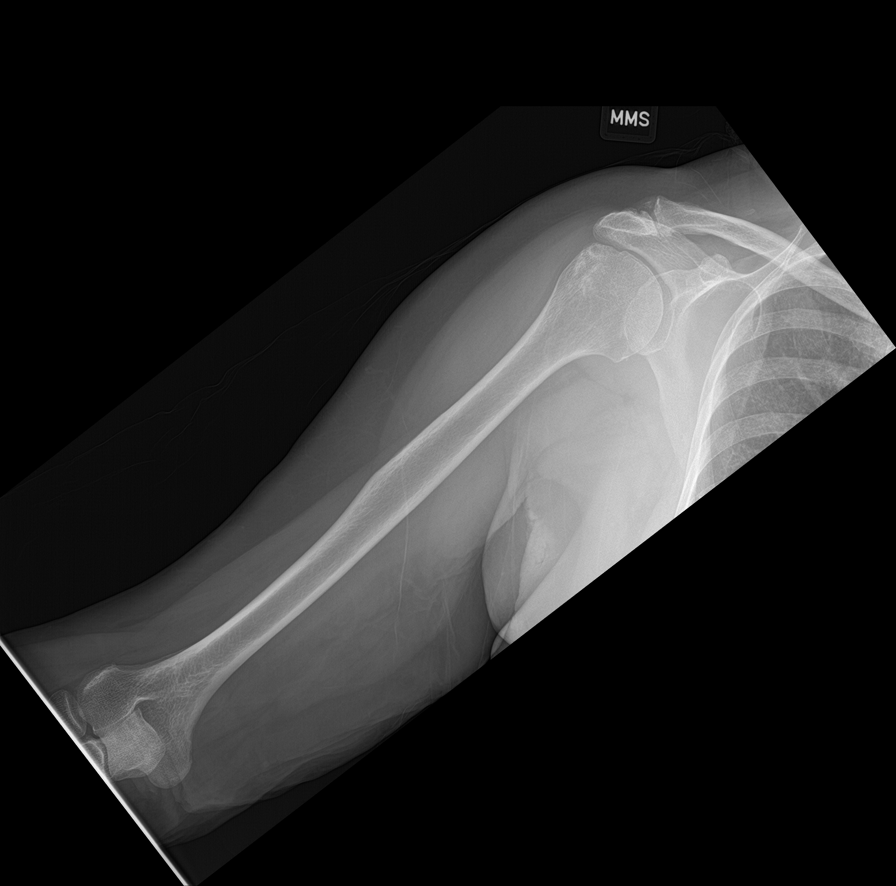

[humerus ap (2 of 2)]
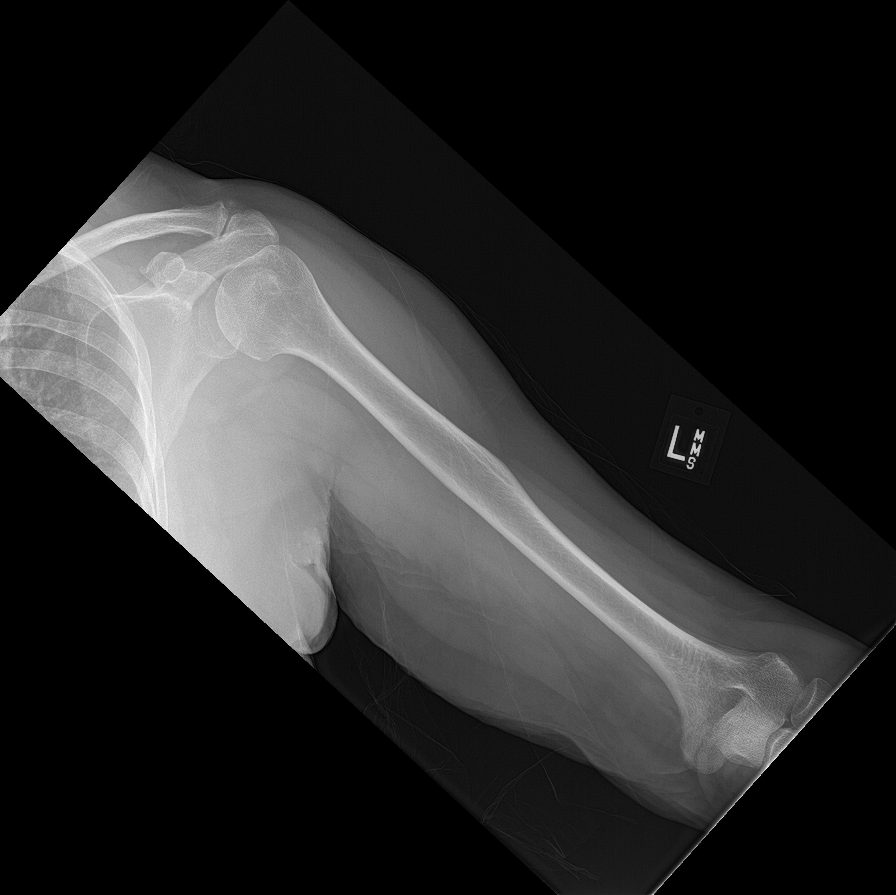

[forearm ap (1 of 2)]
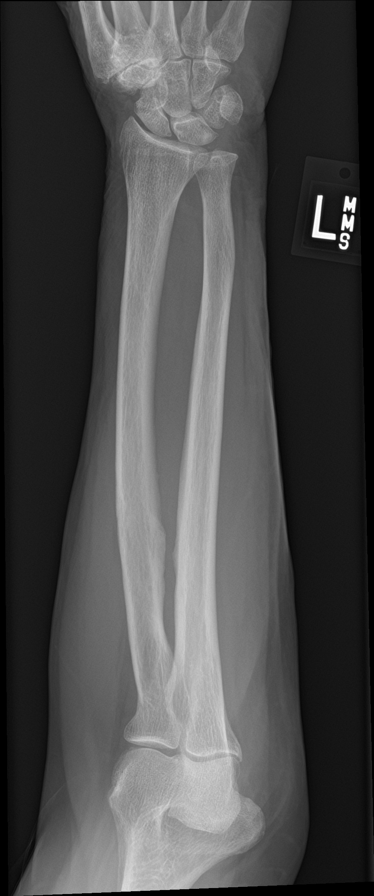

[forearm ap (2 of 2)]
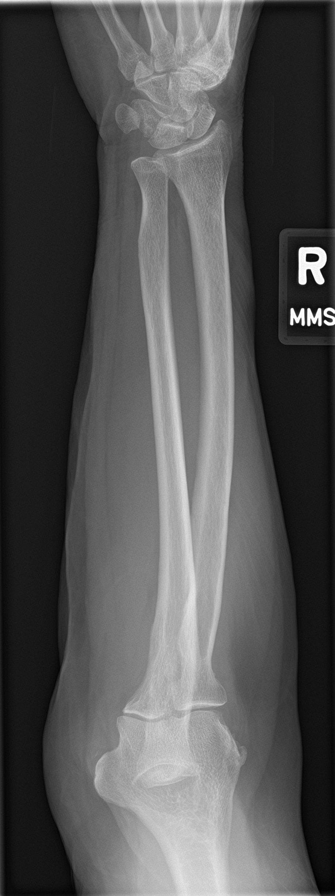

[c-spine ap]
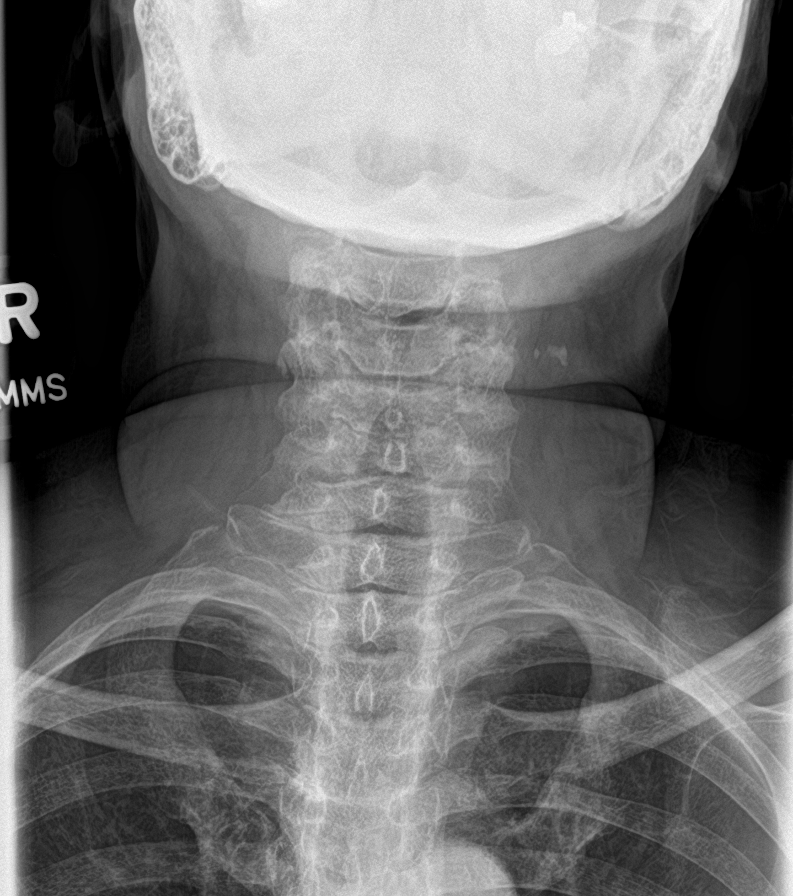

[c-spine lat]
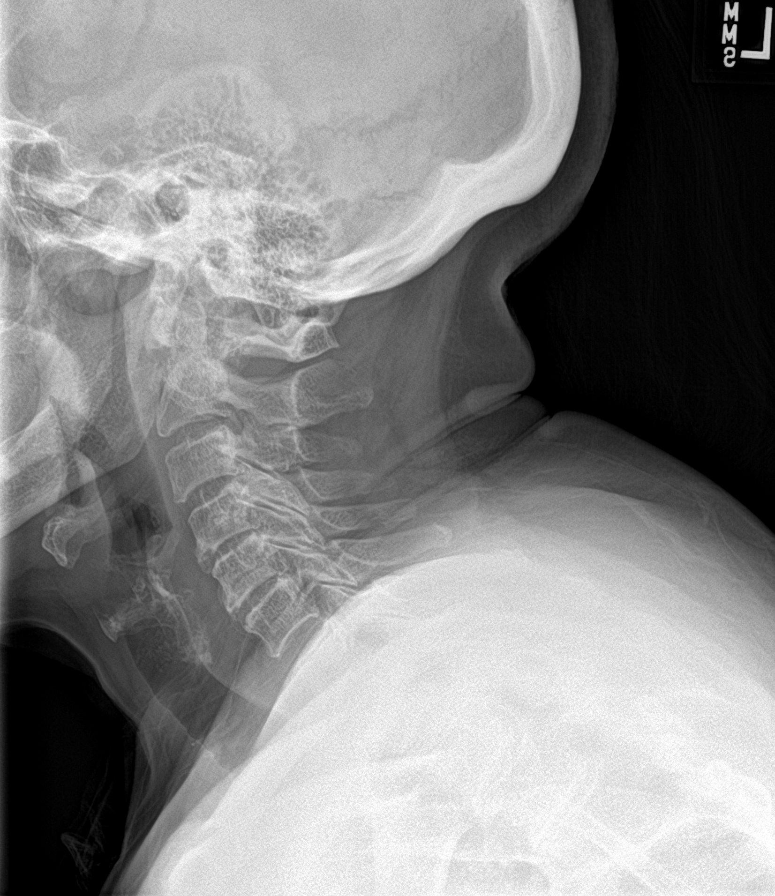

[9 of 10 positions shown; findings below may reference images not displayed]

FINDINGS: Dental hardware. Skull is intact without evidence of lytic lesion.
Carotid artery calcifications. Multilevel degenerative changes
spine.

Degenerative change of the bilateral glenohumeral and
acromioclavicular joints.

Lungs are clear. Normal size heart. Aortic atherosclerosis. No
evidence of bowel obstruction.

Degenerative changes bilateral hips and SI joints.
IMPRESSION: No acute bony or joint abnormality identified. No evidence of lytic
lesion.

## 2021-09-13 ENCOUNTER — Encounter: Payer: Self-pay | Admitting: Podiatry

## 2021-09-13 ENCOUNTER — Other Ambulatory Visit: Payer: Self-pay

## 2021-09-13 ENCOUNTER — Ambulatory Visit (INDEPENDENT_AMBULATORY_CARE_PROVIDER_SITE_OTHER): Payer: Medicare Other | Admitting: Podiatry

## 2021-09-13 DIAGNOSIS — M2042 Other hammer toe(s) (acquired), left foot: Secondary | ICD-10-CM

## 2021-09-13 DIAGNOSIS — D229 Melanocytic nevi, unspecified: Secondary | ICD-10-CM

## 2021-09-13 DIAGNOSIS — M79675 Pain in left toe(s): Secondary | ICD-10-CM | POA: Diagnosis not present

## 2021-09-13 DIAGNOSIS — M2011 Hallux valgus (acquired), right foot: Secondary | ICD-10-CM | POA: Diagnosis not present

## 2021-09-13 DIAGNOSIS — B351 Tinea unguium: Secondary | ICD-10-CM

## 2021-09-13 DIAGNOSIS — M2041 Other hammer toe(s) (acquired), right foot: Secondary | ICD-10-CM | POA: Diagnosis not present

## 2021-09-13 DIAGNOSIS — M2012 Hallux valgus (acquired), left foot: Secondary | ICD-10-CM

## 2021-09-13 DIAGNOSIS — E119 Type 2 diabetes mellitus without complications: Secondary | ICD-10-CM

## 2021-09-13 DIAGNOSIS — M79674 Pain in right toe(s): Secondary | ICD-10-CM | POA: Diagnosis not present

## 2021-09-19 NOTE — Progress Notes (Signed)
ANNUAL DIABETIC FOOT EXAM  Subjective: Gloria Bowen presents today for for annual diabetic foot examination.  Patient relates 5 year h/o diabetes.  Patient denies any h/o foot wounds.  Patient denies any numbness, tingling, burning, or pins/needle sensation in feet.  Patient has occasional mild neuropathy symptoms in the daytime which does not interfere with daily activities. She relates occasional nocturnal leg cramps.  Patient does not monitor blood glucose daily.  Risk factors: CKD, hyperlipidemia, HTN.  She states she has Dermatology appointment in May for evaluation of left 2nd toe macule.  Michael Boston, MD is patient's PCP. Last visit was 3 months ago per patient recall.  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   Normocytic anemia 11/20/2020   Hav (hallux abducto valgus), unspecified laterality 08/07/2020   Hammer toes of both feet 08/07/2020   Pain due to onychomycosis of toenails of both feet 12/06/2019   Diabetes mellitus without complication ([place]) 20/25/4270   Vitamin D deficiency 10/02/2019   Encounter for general adult medical examination without abnormal findings 09/26/2019   Abnormal involuntary movement 08/13/2019   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   Chronic kidney disease due to hypertension 08/13/2019   Chronic kidney disease, stage 3b ([place]) 08/13/2019   Diabetic renal disease ([place]) 08/13/2019   Disorder of bone 08/13/2019   Hyperlipidemia 08/13/2019   Low back pain 08/13/2019   Obesity 08/13/2019   Mild persistent asthma, uncomplicated 62/37/6283   Osteoarthritis 08/13/2019   Personal history of colonic  polyps 08/13/2019   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     aspirin 81 MG tablet Take 81 mg by mouth daily.     b complex vitamins capsule Take 1 capsule by mouth daily.     doxazosin (CARDURA) 4 MG tablet Take 4 mg by mouth daily.     ferrous sulfate 325 (65 FE) MG tablet Take 325 mg by mouth daily with breakfast.     furosemide (LASIX) 20 MG tablet      levothyroxine (SYNTHROID) 88 MCG tablet      losartan (COZAAR) 100 MG tablet Take 100 mg by mouth daily.     metoprolol succinate (TOPROL-XL) 100 MG 24 hr tablet Take 100 mg by mouth daily.     Potassium Chloride ER 20 MEQ TBCR Take 1 tablet by mouth daily.     WIXELA INHUB 250-50 MCG/DOSE AEPB Inhale 1 puff into the lungs daily.     No current facility-administered medications on file prior to visit.    Allergies  Allergen Reactions   Nabumetone Other (See Comments)   Ramipril Other (See Comments)   Social History   Occupational History   Not on file  Tobacco Use   Smoking status: Never   Smokeless tobacco: Never  Substance and Sexual Activity   Alcohol use: Never   Drug use: Never   Sexual activity:  Not on file   Family History  Problem Relation Age of Onset   Breast cancer Sister    Lung cancer Mother        smokeless tobacco use.    Immunization History  Administered Date(s) Administered   PFIZER(Purple Top)SARS-COV-2 Vaccination 06/13/2020     Review of Systems: Negative except as noted in the HPI.   Objective: There were no vitals filed for this visit.  Gloria Bowen is a pleasant 81 y.o. female in NAD. AAO X 3.  Vascular Examination: CFT <3 seconds b/l LE. Palpable DP pulse(s) right lower extremity Palpable PT pulse(s) right lower  extremity Diminished DP pulse(s) left lower extremity. Diminished PT pulse(s) left lower extremity. Pedal hair absent. No pain with calf compression b/l. Lower extremity skin temperature gradient within normal limits. No edema noted b/l LE.  Dermatological Examination: Pedal skin is warm and supple b/l LE. No open wounds b/l LE. No interdigital macerations noted b/l LE. Toenail(s) 1-5 bilaterally elongated, discolored, dystrophic, thickened >1/4 inch. Nails are crumbly with subungual debris and there is exquisite tenderness to dorsal palpation. No subungual wound(s) noted. No hyperkeratotic nor porokeratotic lesions present on today's visit.  Small macule distal tip of left 2nd digit with linear melanotic area subungually. Does not extend beyond proximal nailfold. Unchanged.  Musculoskeletal Examination: Muscle strength 5/5 to all lower extremity muscle groups bilaterally. No pain, crepitus or joint limitation noted with ROM bilateral LE. HAV with bunion deformity noted b/l LE. Hammertoe deformity noted 2-5 b/l. Utilizes cane for ambulation assistance.  Footwear Assessment: Does the patient wear appropriate shoes? Yes. Does the patient need inserts/orthotics? No.  Neurological Examination: Protective sensation intact 5/5 intact bilaterally with 10g monofilament b/l.  Assessment: 1. Pain due to onychomycosis of toenails of both feet   2. Suspicious nevus   3. Hallux valgus, acquired, bilateral   4. Hammer toes of both feet   5. Diabetes mellitus without complication ([place])   6. Encounter for diabetic foot exam ([place])      ADA Risk Categorization: Low Risk :  Patient has all of the following: Intact protective sensation No prior foot ulcer  No severe deformity Pedal pulses present  Plan: -She has appointment with Dermatology in May for evaluation of macule left 2nd digit. -Diabetic foot examination performed today. -Continue foot and shoe inspections daily. Monitor blood glucose per  PCP/Endocrinologist's recommendations. -Mycotic toenails 1-5 bilaterally were debrided in length and girth with sterile nail nippers and dremel without incident. -Patient/POA to call should there be question/concern in the interim.  Return in about 3 months (around 12/11/2021).  Marzetta Board, DPM

## 2021-11-23 ENCOUNTER — Inpatient Hospital Stay: Payer: Medicare Other

## 2021-11-23 ENCOUNTER — Telehealth: Payer: Self-pay | Admitting: Physician Assistant

## 2021-11-23 ENCOUNTER — Inpatient Hospital Stay: Payer: Medicare Other | Admitting: Physician Assistant

## 2021-11-23 NOTE — Telephone Encounter (Signed)
Scheduled per 4/25 los, pt has confirmed new appt ?

## 2021-11-29 ENCOUNTER — Other Ambulatory Visit: Payer: Self-pay | Admitting: Physician Assistant

## 2021-11-29 DIAGNOSIS — D472 Monoclonal gammopathy: Secondary | ICD-10-CM

## 2021-11-30 ENCOUNTER — Inpatient Hospital Stay: Payer: Medicare Other | Attending: Physician Assistant

## 2021-11-30 ENCOUNTER — Other Ambulatory Visit: Payer: Self-pay

## 2021-11-30 ENCOUNTER — Inpatient Hospital Stay (HOSPITAL_BASED_OUTPATIENT_CLINIC_OR_DEPARTMENT_OTHER): Payer: Medicare Other | Admitting: Physician Assistant

## 2021-11-30 VITALS — BP 130/61 | HR 72 | Temp 97.3°F | Resp 15 | Wt 145.3 lb

## 2021-11-30 DIAGNOSIS — D472 Monoclonal gammopathy: Secondary | ICD-10-CM | POA: Diagnosis present

## 2021-11-30 DIAGNOSIS — M25551 Pain in right hip: Secondary | ICD-10-CM | POA: Diagnosis not present

## 2021-11-30 DIAGNOSIS — Z801 Family history of malignant neoplasm of trachea, bronchus and lung: Secondary | ICD-10-CM | POA: Diagnosis not present

## 2021-11-30 DIAGNOSIS — D649 Anemia, unspecified: Secondary | ICD-10-CM

## 2021-11-30 DIAGNOSIS — Z803 Family history of malignant neoplasm of breast: Secondary | ICD-10-CM | POA: Diagnosis not present

## 2021-11-30 DIAGNOSIS — Z79899 Other long term (current) drug therapy: Secondary | ICD-10-CM | POA: Insufficient documentation

## 2021-11-30 DIAGNOSIS — N1832 Chronic kidney disease, stage 3b: Secondary | ICD-10-CM | POA: Insufficient documentation

## 2021-11-30 DIAGNOSIS — R200 Anesthesia of skin: Secondary | ICD-10-CM | POA: Diagnosis not present

## 2021-11-30 DIAGNOSIS — M25552 Pain in left hip: Secondary | ICD-10-CM | POA: Insufficient documentation

## 2021-11-30 DIAGNOSIS — R0602 Shortness of breath: Secondary | ICD-10-CM | POA: Diagnosis not present

## 2021-11-30 DIAGNOSIS — M545 Low back pain, unspecified: Secondary | ICD-10-CM | POA: Insufficient documentation

## 2021-11-30 LAB — CMP (CANCER CENTER ONLY)
ALT: 16 U/L (ref 0–44)
AST: 21 U/L (ref 15–41)
Albumin: 4 g/dL (ref 3.5–5.0)
Alkaline Phosphatase: 51 U/L (ref 38–126)
Anion gap: 9 (ref 5–15)
BUN: 43 mg/dL — ABNORMAL HIGH (ref 8–23)
CO2: 28 mmol/L (ref 22–32)
Calcium: 9.3 mg/dL (ref 8.9–10.3)
Chloride: 103 mmol/L (ref 98–111)
Creatinine: 1.59 mg/dL — ABNORMAL HIGH (ref 0.44–1.00)
GFR, Estimated: 32 mL/min — ABNORMAL LOW (ref >60)
Glucose, Bld: 191 mg/dL — ABNORMAL HIGH (ref 70–99)
Potassium: 3.7 mmol/L (ref 3.5–5.1)
Sodium: 140 mmol/L (ref 135–145)
Total Bilirubin: 0.4 mg/dL (ref 0.3–1.2)
Total Protein: 7.6 g/dL (ref 6.5–8.1)

## 2021-11-30 LAB — CBC WITH DIFFERENTIAL (CANCER CENTER ONLY)
Abs Immature Granulocytes: 0 10*3/uL (ref 0.00–0.07)
Basophils Absolute: 0 10*3/uL (ref 0.0–0.1)
Basophils Relative: 1 %
Eosinophils Absolute: 0.1 10*3/uL (ref 0.0–0.5)
Eosinophils Relative: 3 %
HCT: 31 % — ABNORMAL LOW (ref 36.0–46.0)
Hemoglobin: 9.9 g/dL — ABNORMAL LOW (ref 12.0–15.0)
Immature Granulocytes: 0 %
Lymphocytes Relative: 41 %
Lymphs Abs: 1.4 10*3/uL (ref 0.7–4.0)
MCH: 27.7 pg (ref 26.0–34.0)
MCHC: 31.9 g/dL (ref 30.0–36.0)
MCV: 86.6 fL (ref 80.0–100.0)
Monocytes Absolute: 0.4 10*3/uL (ref 0.1–1.0)
Monocytes Relative: 12 %
Neutro Abs: 1.5 10*3/uL — ABNORMAL LOW (ref 1.7–7.7)
Neutrophils Relative %: 43 %
Platelet Count: 167 10*3/uL (ref 150–400)
RBC: 3.58 MIL/uL — ABNORMAL LOW (ref 3.87–5.11)
RDW: 13.4 % (ref 11.5–15.5)
WBC Count: 3.3 10*3/uL — ABNORMAL LOW (ref 4.0–10.5)
nRBC: 0 % (ref 0.0–0.2)

## 2021-11-30 LAB — VITAMIN B12: Vitamin B-12: 597 pg/mL (ref 180–914)

## 2021-11-30 NOTE — Progress Notes (Signed)
?[facility] ?Telephone:([phone]) [phone]   [phone] ? ?HEMATOLOGY AND ONCOLOGY PROGRSS NOTE ? ?Patient Care Team: ?Michael Boston, MD as PCP - General (Internal Medicine) ? ? ?CHIEF COMPLAINTS: MGUS ? ?HISTORY OF PRESENTING ILLNESS:  ?Gloria Bowen 81 y.o. 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 ?She  is unaccompanied for this visit. She reports that her energy levels are fairly stable. She uses a cane to ambulate but feels fairly independent. She denies any appetite or weight changes.   She denies any nausea, vomiting or abdominal pain.  Her bowel movements are fairly unchanged.  She denies easy bruising or signs of bleeding.  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  She does have issues with grip and balance due to neuropathy.  Her bilateral hip pain and low back pain continue to affect her but has not changed in intensity or frequency. Patient has chronic shortness of breath with exertion but none at rest. Patient denies any fevers, chills, night sweats, chest pain or cough. She has no other complaints. Rest of the 10 point ROS is below.  ? ?MEDICAL HISTORY:  ?Past Medical History:  ?Diagnosis Date  ? CKD (chronic kidney disease) stage 3, GFR 30-59 ml/min (HCC)   ? Diabetes ([place])   ? HTN (hypertension)   ? Hyperlipidemia   ? Hypothyroidism 1991  ? s/p partial thyroidectomy  ? ? ?SURGICAL HISTORY: ?Past Surgical History:  ?Procedure Laterality Date  ? THYROIDECTOMY, PARTIAL  1991  ? ? ?SOCIAL HISTORY: ?Social History  ? ?Socioeconomic History  ? Marital status: Single  ?  Spouse name: Not on file  ? Number of children: Not on file  ? Years of education: Not on file  ? Highest education level: Not on file  ?Occupational History  ? Not on file  ?Tobacco Use  ? Smoking status: Never  ? Smokeless tobacco: Never  ?Substance and Sexual Activity  ? Alcohol use: Never  ? Drug use: Never  ? Sexual activity: Not on file  ?Other Topics Concern  ? Not on file   ?Social History Narrative  ? ** Merged History Encounter **  ?    ? ?Social Determinants of Health  ? ?Financial Resource Strain: Not on file  ?Food Insecurity: Not on file  ?Transportation Needs: Not on file  ?Physical Activity: Not on file  ?Stress: Not on file  ?Social Connections: Not on file  ?Intimate Partner Violence: Not on file  ? ? ?FAMILY HISTORY: ?Family History  ?Problem Relation Age of Onset  ? Breast cancer Sister   ? Lung cancer Mother   ?     smokeless tobacco use.   ? ? ?ALLERGIES:  is allergic to nabumetone and ramipril. ? ?MEDICATIONS:  ?Current Outpatient Medications  ?Medication Sig Dispense Refill  ? amLODipine (NORVASC) 10 MG tablet Take 10 mg by mouth daily.    ? aspirin 81 MG tablet Take 81 mg by mouth daily.    ? b complex vitamins capsule Take 1 capsule by mouth daily.    ? doxazosin (CARDURA) 4 MG tablet Take 4 mg by mouth daily.    ? ferrous sulfate 325 (65 FE) MG tablet Take 325 mg by mouth daily with breakfast.    ? furosemide (LASIX) 20 MG tablet     ? levothyroxine (SYNTHROID) 88 MCG tablet     ? losartan (COZAAR) 100 MG tablet Take 100 mg by mouth daily.    ? metoprolol succinate (TOPROL-XL) 100  MG 24 hr tablet Take 100 mg by mouth daily.    ? Potassium Chloride ER 20 MEQ TBCR Take 1 tablet by mouth daily.    ? WIXELA INHUB 250-50 MCG/DOSE AEPB Inhale 1 puff into the lungs daily.    ? ?No current facility-administered medications for this visit.  ? ? ?REVIEW OF SYSTEMS:   ?Constitutional: ( - ) fevers, ( - )  chills , ( - ) night sweats ?Eyes: ( - ) blurriness of vision, ( - ) double vision, ( - ) watery eyes ?Ears, nose, mouth, throat, and face: ( - ) mucositis, ( - ) sore throat ?Respiratory: ( - ) cough, ( - ) dyspnea, ( - ) wheezes ?Cardiovascular: ( - ) palpitation, ( - ) chest discomfort, ( - ) lower extremity swelling ?Gastrointestinal:  ( - ) nausea, ( - ) heartburn, ( - ) change in bowel habits ?Skin: ( - ) abnormal skin rashes ?Lymphatics: ( - ) new lymphadenopathy, ( -  ) easy bruising ?Neurological: ( + ) numbness, ( - ) tingling, ( - ) new weaknesses ?Behavioral/Psych: ( - ) mood change, ( - ) new changes  ?All other systems were reviewed with the patient and are negative. ? ?PHYSICAL EXAMINATION: ?ECOG PERFORMANCE STATUS: 0 - Asymptomatic ? ?Vitals:  ? 11/30/21 1253  ?BP: 130/61  ?Pulse: 72  ?Resp: 15  ?Temp: (!) 97.3 ?F (36.3 ?C)  ?SpO2: 96%  ? ?Filed Weights  ? 11/30/21 1253  ?Weight: 145 lb 4.8 oz (65.9 kg)  ? ? ?GENERAL: well appearing African American female in NAD  ?SKIN: skin color, texture, turgor are normal, no rashes or significant lesions ?EYES: conjunctiva are pink and non-injected, sclera clear ?LUNGS: clear to auscultation and percussion with normal breathing effort ?HEART: regular rate & rhythm and no murmurs and no lower extremity edema ?Musculoskeletal: no cyanosis of digits and no clubbing.  ?PSYCH: alert & oriented x 3, fluent speech ?NEURO: no focal motor/sensory deficits ? ?LABORATORY DATA:  ?I have reviewed the data as listed ? ?  Latest Ref Rng & Units 11/30/2021  ? 12:30 PM 05/25/2021  ? 10:12 AM 02/23/2021  ? 10:40 AM  ?CBC  ?WBC 4.0 - 10.5 K/uL 3.3   3.4   3.5    ?Hemoglobin 12.0 - 15.0 g/dL 9.9   9.8   9.6    ?Hematocrit 36.0 - 46.0 % 31.0   29.9   29.8    ?Platelets 150 - 400 K/uL 167   181   176    ? ? ? ?  Latest Ref Rng & Units 11/30/2021  ? 12:30 PM 05/25/2021  ? 10:12 AM 02/23/2021  ? 10:40 AM  ?CMP  ?Glucose 70 - 99 mg/dL 191   127   120    ?BUN 8 - 23 mg/dL 43   29   36    ?Creatinine 0.44 - 1.00 mg/dL 1.59   1.33   1.41    ?Sodium 135 - 145 mmol/L 140   141   143    ?Potassium 3.5 - 5.1 mmol/L 3.7   3.8   4.0    ?Chloride 98 - 111 mmol/L 103   107   108    ?CO2 22 - 32 mmol/L _0 ?Calcium 8.9 - 10.3 mg/dL 9.3   9.2   9.5    ?Total Protein 6.5 - 8.1 g/dL 7.6   7.4   8.0    ?Total  Bilirubin 0.3 - 1.2 mg/dL 0.4   0.4   0.3    ?Alkaline Phos 38 - 126 U/L 51   60   63    ?AST 15 - 41 U/L _0 ?ALT 0 - 44 U/L _1 ? ? ? ?ASSESSMENT & PLAN ?Gloria Bowen is a 81 y.o. female presenting to the clinic for routine follow up for MGUS. ?  ?#MGUS: ?--DG bone met survey from 12/08/2020 showed no evidence of lytic lesions. Next one scheduled for 12/03/2021.  ?--CBC shows stable anemia and neutropenia, Hgb 9.9, WBC 3.3, ANC 1.5. CMP shows stable creatinine level at 1.59. No evidence of hypercalcemia.  ?--SPEP with IFE, UPEP and serum free light chains from today are pending.  ?--RTC in 6 months with labs unless pending labs from today require further intervention. ? ?#Normocytic Anemia: ?--Likely secondary to chronic kidney disease.  ?--Patient is currently taking ferrous sulfate 325 mg daily as prescribed by PCP.  ?--Patient denies any signs of bleeding and colonoscopy screens have been deferred due to age. ?--B12, Folate and Iron were checked on 11/20/2020, all unremarkable except for iron saturation on the low end of normal at 22% and ferritin elevated at 509.  ? ? ?Orders Placed This Encounter  ?Procedures  ? Vitamin B12  ?  Standing Status:   Future  ?  Number of Occurrences:   1  ?  Standing Expiration Date:   11/30/2022  ? ? ?All questions were answered. The patient knows to call the clinic with any problems, questions or concerns. ? ?I have spent a total of 25 minutes minutes of face-to-face and non-face-to-face time, preparing to see the patient, obtaining and/or reviewing separately obtained history, performing a medically appropriate examination, counseling and educating the patient, ordering tests,  documenting clinical information in the electronic health record, and care coordination.  ? ? ?Dede Query, PA-C ?Department of Hematology/Oncology ?[place] [facility] ?Phone: [phone] ?

## 2021-12-01 ENCOUNTER — Telehealth: Payer: Self-pay | Admitting: Hematology and Oncology

## 2021-12-01 LAB — KAPPA/LAMBDA LIGHT CHAINS
Kappa free light chain: 53.3 mg/L — ABNORMAL HIGH (ref 3.3–19.4)
Kappa, lambda light chain ratio: 1.9 — ABNORMAL HIGH (ref 0.26–1.65)
Lambda free light chains: 28.1 mg/L — ABNORMAL HIGH (ref 5.7–26.3)

## 2021-12-01 NOTE — Telephone Encounter (Signed)
Scheduled per 5/2 los, pt confirmed appt ?

## 2021-12-03 ENCOUNTER — Ambulatory Visit (HOSPITAL_COMMUNITY)
Admission: RE | Admit: 2021-12-03 | Discharge: 2021-12-03 | Disposition: A | Discharge status: Home / Self Care | Payer: Medicare Other | Source: Ambulatory Visit | Attending: Physician Assistant | Admitting: Physician Assistant

## 2021-12-03 DIAGNOSIS — D472 Monoclonal gammopathy: Secondary | ICD-10-CM | POA: Diagnosis present

## 2021-12-03 LAB — MULTIPLE MYELOMA PANEL, SERUM
Albumin SerPl Elph-Mcnc: 3.8 g/dL (ref 2.9–4.4)
Albumin/Glob SerPl: 1.2 (ref 0.7–1.7)
Alpha 1: 0.2 g/dL (ref 0.0–0.4)
Alpha2 Glob SerPl Elph-Mcnc: 0.7 g/dL (ref 0.4–1.0)
B-Globulin SerPl Elph-Mcnc: 0.9 g/dL (ref 0.7–1.3)
Gamma Glob SerPl Elph-Mcnc: 1.5 g/dL (ref 0.4–1.8)
Globulin, Total: 3.3 g/dL (ref 2.2–3.9)
IgA: 259 mg/dL (ref 64–422)
IgG (Immunoglobin G), Serum: 1566 mg/dL (ref 586–1602)
IgM (Immunoglobulin M), Srm: 112 mg/dL (ref 26–217)
M Protein SerPl Elph-Mcnc: 0.6 g/dL — ABNORMAL HIGH
Total Protein ELP: 7.1 g/dL (ref 6.0–8.5)

## 2021-12-06 LAB — UPEP/UIFE/LIGHT CHAINS/TP, 24-HR UR
% BETA, Urine: 10.9 %
ALPHA 1 URINE: 3 %
Albumin, U: 71.6 %
Alpha 2, Urine: 3.2 %
Free Kappa Lt Chains,Ur: 42.94 mg/L (ref 1.17–86.46)
Free Kappa/Lambda Ratio: 3.75 (ref 1.83–14.26)
Free Lambda Lt Chains,Ur: 11.46 mg/L (ref 0.27–15.21)
GAMMA GLOBULIN URINE: 11.3 %
Total Protein, Urine-Ur/day: 576 mg/24 hr — ABNORMAL HIGH (ref 30–150)
Total Protein, Urine: 52.4 mg/dL
Total Volume: 1100

## 2021-12-07 ENCOUNTER — Telehealth: Payer: Self-pay | Admitting: Physician Assistant

## 2021-12-07 DIAGNOSIS — D472 Monoclonal gammopathy: Secondary | ICD-10-CM

## 2021-12-07 NOTE — Telephone Encounter (Signed)
I called Ms. Gloria Bowen today to review the bone survey from 12/03/2021 as continued MGUS surveillance. Findings revealed new 9 mm lucent area in the humeral head that may represent a lytic lesion. Patient adds that she does have some discomfort in the right shoulder area. Recommend a PET scan to further evaluate this area. Patient is in agreement to obtain PET scan. We will call patient once PET scan is scheduled.  ?

## 2021-12-08 ENCOUNTER — Ambulatory Visit: Payer: Medicare Other | Admitting: Physician Assistant

## 2021-12-13 ENCOUNTER — Ambulatory Visit: Payer: Medicare Other | Admitting: Podiatry

## 2021-12-15 ENCOUNTER — Ambulatory Visit (HOSPITAL_COMMUNITY)
Admission: RE | Admit: 2021-12-15 | Discharge: 2021-12-15 | Disposition: A | Discharge status: Home / Self Care | Payer: Medicare Other | Source: Ambulatory Visit | Attending: Physician Assistant | Admitting: Physician Assistant

## 2021-12-15 DIAGNOSIS — D472 Monoclonal gammopathy: Secondary | ICD-10-CM | POA: Insufficient documentation

## 2021-12-15 LAB — GLUCOSE, CAPILLARY: Glucose-Capillary: 124 mg/dL — ABNORMAL HIGH (ref 70–99)

## 2021-12-15 MED ORDER — FLUDEOXYGLUCOSE F - 18 (FDG) INJECTION
7.2000 | Freq: Once | INTRAVENOUS | Status: AC | PRN
  Administered 2021-12-15: 7.2 via INTRAVENOUS

## 2021-12-16 ENCOUNTER — Telehealth: Payer: Self-pay | Admitting: Physician Assistant

## 2021-12-16 NOTE — Telephone Encounter (Signed)
I called Ms. Gloria Bowen to review the PET scan results from 12/15/2021.  There was no evidence of lytic lesions to suggest multiple myeloma.  Recommend to continue on observation for MGUS.  She will return in 6 months for her routine visit.

## 2021-12-20 ENCOUNTER — Encounter: Payer: Self-pay | Admitting: Podiatry

## 2021-12-20 ENCOUNTER — Ambulatory Visit (INDEPENDENT_AMBULATORY_CARE_PROVIDER_SITE_OTHER): Payer: Medicare Other | Admitting: Podiatry

## 2021-12-20 DIAGNOSIS — B351 Tinea unguium: Secondary | ICD-10-CM

## 2021-12-20 DIAGNOSIS — M2011 Hallux valgus (acquired), right foot: Secondary | ICD-10-CM

## 2021-12-20 DIAGNOSIS — M2042 Other hammer toe(s) (acquired), left foot: Secondary | ICD-10-CM

## 2021-12-20 DIAGNOSIS — M79674 Pain in right toe(s): Secondary | ICD-10-CM

## 2021-12-20 DIAGNOSIS — M2012 Hallux valgus (acquired), left foot: Secondary | ICD-10-CM

## 2021-12-20 DIAGNOSIS — E119 Type 2 diabetes mellitus without complications: Secondary | ICD-10-CM | POA: Diagnosis not present

## 2021-12-20 DIAGNOSIS — M2041 Other hammer toe(s) (acquired), right foot: Secondary | ICD-10-CM | POA: Diagnosis not present

## 2021-12-20 DIAGNOSIS — M79675 Pain in left toe(s): Secondary | ICD-10-CM

## 2021-12-21 ENCOUNTER — Telehealth: Payer: Self-pay

## 2021-12-21 NOTE — Telephone Encounter (Signed)
CMN Submitted

## 2021-12-25 NOTE — Progress Notes (Signed)
  Subjective:  Patient ID: Gloria Bowen, female    DOB: [birth year],  MRN: [id number]  Gloria Bowen presents to clinic today for preventative diabetic foot care and painful elongated mycotic toenails 1-5 bilaterally which are tender when wearing enclosed shoe gear. Pain is relieved with periodic professional debridement.  Last known HgA1c was unknown. Patient does not monitor blood glucose daily.  New problem(s): None.   Patient is requesting diabetic shoes on today's visit.  PCP is Michael Boston, MD , and last visit was Dec 17, 2021.  Allergies  Allergen Reactions   Nabumetone Other (See Comments)   Ramipril Other (See Comments)    Review of Systems: Negative except as noted in the HPI.  Objective: No changes noted in today's physical examination. There were no vitals filed for this visit.  Gloria Bowen is a pleasant 81 y.o. female in NAD. AAO X 3.  Vascular Examination: CFT <3 seconds b/l LE. Palpable DP pulse(s) right lower extremity. Palpable PT pulse(s) right lower extremity. Diminished DP pulse(s) left lower extremity. Diminished PT pulse(s) left lower extremity. Pedal hair absent. No pain with calf compression b/l. Lower extremity skin temperature gradient within normal limits. No edema noted b/l LE.  Dermatological Examination: Pedal skin is warm and supple b/l LE. No open wounds b/l LE. No interdigital macerations noted b/l LE. Toenail(s) 1-5 bilaterally elongated, discolored, dystrophic, thickened >1/4 inch. Nails are crumbly with subungual debris and there is exquisite tenderness to dorsal palpation. No subungual wound(s) noted. No hyperkeratotic nor porokeratotic lesions present on today's visit.  Small macule distal tip of left 2nd digit with linear melanotic area subungually. Does not extend beyond proximal nailfold. Unchanged.  Musculoskeletal Examination: Muscle strength 5/5 to all lower extremity muscle groups bilaterally. No pain, crepitus or  joint limitation noted with ROM bilateral LE. HAV with bunion deformity noted b/l LE. Hammertoe deformity noted 2-5 b/l. Utilizes cane for ambulation assistance.  Neurological Examination: Protective sensation intact 5/5 intact bilaterally with 10g monofilament b/l.  Assessment/Plan: 1. Pain due to onychomycosis of toenails of both feet   2. Hallux valgus, acquired, bilateral   3. Hammer toes of both feet   4. Diabetes mellitus without complication ([place])     -Patient was evaluated and treated. All patient's and/or POA's questions/concerns answered on today's visit. -Continue diabetic foot care principles: inspect feet daily, monitor glucose as recommended by PCP and/or Endocrinologist, and follow prescribed diet per PCP, Endocrinologist and/or dietician. -Patient to continue soft, supportive shoe gear daily. Order entered for one pair extra depth shoes and 3 pair heat moldable insoles. Patient qualifies based on diagnoses. -Toenails 1-5 b/l were debrided in length and girth with sterile nail nippers and dremel without iatrogenic bleeding.  -Patient/POA to call should there be question/concern in the interim.   Return in about 3 months (around 03/22/2022).  Marzetta Board, DPM

## 2022-01-03 ENCOUNTER — Ambulatory Visit: Payer: Medicare Other

## 2022-01-03 DIAGNOSIS — M2011 Hallux valgus (acquired), right foot: Secondary | ICD-10-CM

## 2022-01-03 DIAGNOSIS — M2041 Other hammer toe(s) (acquired), right foot: Secondary | ICD-10-CM

## 2022-01-03 DIAGNOSIS — E119 Type 2 diabetes mellitus without complications: Secondary | ICD-10-CM

## 2022-01-03 NOTE — Progress Notes (Signed)
SITUATION Reason for Consult: Evaluation for Prefabricated Diabetic Shoes and Custom Diabetic Inserts. Patient / Caregiver Report: Patient would like well fitting shoes  OBJECTIVE DATA: Patient History / Diagnosis:    ICD-10-CM   1. Diabetes mellitus without complication (HCC)  I69.6     2. Hallux valgus, acquired, bilateral  M20.11    M20.12     3. Hammer toes of both feet  M20.41    M20.42       Physician Treating Diabetes:  Michael Boston, MD  Current or Previous Devices:   None and no history  In-Person Foot Examination: Ulcers & Callousing:   None Deformities:    Hallux valgus, hammertoes Sensation:    Intact  Shoe Size:     7W  ORTHOTIC RECOMMENDATION Recommended Devices: - 1x pair prefabricated PDAC approved diabetic shoes; Patient Selected Gloria Bowen 988 Size 7W - 3x pair custom-to-patient PDAC approved vacuum formed diabetic insoles.  GOALS OF SHOES AND INSOLES - Reduce shear and pressure - Reduce / Prevent callus formation - Reduce / Prevent ulceration - Protect the fragile healing compromised diabetic foot.  Patient would benefit from diabetic shoes and inserts as patient has diabetes mellitus and the patient has one or more of the following conditions: - History of partial or complete amputation of the foot - History of previous foot ulceration. - History of pre-ulcerative callus - Peripheral neuropathy with evidence of callus formation - Foot deformity - Poor circulation  ACTIONS PERFORMED Potential out of pocket cost was communicated to patient. Patient understood and consented to measurement and casting. Patient was casted for insoles via crush box and measured for shoes via brannock device. Procedure was explained and patient tolerated procedure well. All questions were answered and concerns addressed. CMN sent to treating physician. Casts were shipped to central fabrication for HOLD until Certificate of Medical Necessity or otherwise necessary  authorization from insurance is obtained.  PLAN Shoes are to be ordered and casts released from hold once all appropriate paperwork is complete. Patient is to be contacted and scheduled for fitting once shoes and insoles have been fabricated and received.

## 2022-01-05 ENCOUNTER — Ambulatory Visit (INDEPENDENT_AMBULATORY_CARE_PROVIDER_SITE_OTHER): Payer: Medicare Other | Admitting: Physician Assistant

## 2022-01-05 DIAGNOSIS — L608 Other nail disorders: Secondary | ICD-10-CM

## 2022-01-05 DIAGNOSIS — D229 Melanocytic nevi, unspecified: Secondary | ICD-10-CM

## 2022-01-05 NOTE — Patient Instructions (Signed)
Please contact [facility] for biopsy of toe.

## 2022-01-10 ENCOUNTER — Encounter: Payer: Self-pay | Admitting: Physician Assistant

## 2022-01-10 NOTE — Progress Notes (Signed)
   New Patient   Subjective  Gloria Bowen is a 81 y.o. female who presents for the following: New Patient (Initial Visit) (Patients PCP referred her over here for dark lesion on left second toe.  No history of skin cancer. ).   The following portions of the chart were reviewed this encounter and updated as appropriate:  Tobacco  Allergies  Meds  Problems  Med Hx  Surg Hx  Fam Hx      Objective  Well appearing patient in no apparent distress; mood and affect are within normal limits.  A focused examination was performed including feet. Relevant physical exam findings are noted in the Assessment and Plan.  Left 2nd Proximal Nail of Toe Longitudinal, bichromic band that extends from the cuticle to the end of the nail. Absent Hutchinson's sign.       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 Refer back to Dr. Rogers Blocker.     I, Celine Dishman, PA-C, have reviewed all documentation's for this visit.  The documentation on 01/10/22 for the exam, diagnosis, procedures and orders are all accurate and complete.

## 2022-01-17 ENCOUNTER — Telehealth: Payer: Self-pay

## 2022-01-17 NOTE — Telephone Encounter (Signed)
CMN Received - Shoes ordered and casts released from fabrication hold.  

## 2022-03-25 ENCOUNTER — Ambulatory Visit (INDEPENDENT_AMBULATORY_CARE_PROVIDER_SITE_OTHER): Payer: Medicare Other | Admitting: Podiatry

## 2022-03-25 ENCOUNTER — Encounter: Payer: Self-pay | Admitting: Podiatry

## 2022-03-25 DIAGNOSIS — M2011 Hallux valgus (acquired), right foot: Secondary | ICD-10-CM

## 2022-03-25 DIAGNOSIS — D229 Melanocytic nevi, unspecified: Secondary | ICD-10-CM

## 2022-03-25 DIAGNOSIS — E119 Type 2 diabetes mellitus without complications: Secondary | ICD-10-CM | POA: Diagnosis not present

## 2022-03-25 DIAGNOSIS — B351 Tinea unguium: Secondary | ICD-10-CM

## 2022-03-25 DIAGNOSIS — M2042 Other hammer toe(s) (acquired), left foot: Secondary | ICD-10-CM

## 2022-03-25 DIAGNOSIS — M79674 Pain in right toe(s): Secondary | ICD-10-CM

## 2022-03-25 DIAGNOSIS — M2041 Other hammer toe(s) (acquired), right foot: Secondary | ICD-10-CM | POA: Diagnosis not present

## 2022-03-25 DIAGNOSIS — M79675 Pain in left toe(s): Secondary | ICD-10-CM | POA: Diagnosis not present

## 2022-03-25 DIAGNOSIS — M2012 Hallux valgus (acquired), left foot: Secondary | ICD-10-CM

## 2022-03-25 NOTE — Progress Notes (Unsigned)
  Subjective:  Patient ID: Gloria Bowen, female    DOB: 31-Mar-1941,  MRN: [id number]  [name] presents to clinic today for preventative diabetic foot care and painful thick toenails that are difficult to trim. Pain interferes with ambulation. Aggravating factors include wearing enclosed shoe gear. Pain is relieved with periodic professional debridement.  She is inquiring about status of her diabetic shoes.  Last A1c was unknown. Patient does not monitor blood glucose daily.  Patient did see Robyne Askew, PA-C, in Dermatology, and biopsy of the toe was recommended for melanonychia of digit. Ms. Fear was referred back to our office for the biopsy of the left 2nd toe.  PCP is Michael Boston, MD , and last visit was March 23, 2022.  Allergies  Allergen Reactions   Nabumetone Other (See Comments)   Ramipril Other (See Comments)    Review of Systems: Negative except as noted in the HPI.  Objective: No changes noted in today's physical examination.  Vascular Examination: Palpable pedal pulses RLE. Diminished pulses LLE. Digital hair absent b/l. No pedal edema b/l. Skin temperature gradient WNL b/l. No varicosities b/l. No edema noted b/l LE.Marland Kitchen  Dermatological Examination: Pedal skin with normal turgor, texture and tone b/l. No open wounds. No interdigital macerations b/l. Toenails 1-5 b/l thickened, discolored, dystrophic with subungual debris. There is pain on palpation to dorsal aspect of nailplates.  Longitudinal melanonychia of left 2nd toenail with extension of of hyperpigmented .Marland Kitchen  Neurological Examination: Protective sensation intact with 10 gram monofilament b/l LE. Vibratory sensation intact b/l LE. {jgneuro:23601::"Protective sensation intact 5/5 intact bilaterally with 10g monofilament b/l.","Vibratory sensation intact b/l.","Proprioception intact bilaterally."}  Musculoskeletal Examination: Muscle strength 5/5 to all LE muscle groups b/l.  {jgmsk:23600}  Xray findings {jgPodToeLocator:23637}: {jgxrayfindings:23683}       No data to display          Assessment/Plan: No diagnosis found.   {Jgplan:23602::"-Patient/POA to call should there be question/concern in the interim."}   No follow-ups on file.  Marzetta Board, DPM

## 2022-04-26 ENCOUNTER — Ambulatory Visit (INDEPENDENT_AMBULATORY_CARE_PROVIDER_SITE_OTHER): Payer: Medicare Other | Admitting: Podiatry

## 2022-04-26 DIAGNOSIS — L608 Other nail disorders: Secondary | ICD-10-CM | POA: Diagnosis not present

## 2022-04-26 NOTE — Progress Notes (Signed)
  Subjective:  Patient ID: Gloria Bowen, female    DOB: 1940-12-08,  MRN: [id number]  Chief Complaint  Patient presents with   lesion      biopsy of left 2nd toe recommended by Dermatology-Per Dr. Elisha Ponder    81 y.o. female presents with the above complaint. History confirmed with patient.  She has a dark discoloration in her nail plate that Dr. Elisha Ponder had noticed, she says it has probably been there for some time but she does not remember when it started it does not hurt and does not have problems with it no history of skin cancer personally or within her family  Objective:  Physical Exam: warm, good capillary refill, no trophic changes or ulcerative lesions, normal DP and PT pulses, normal sensory exam, and longitudinal melanonychia striata left second toe, less so on the left hallux and right hallux.  No evidence of Hutchinson sign proximally      Assessment:   1. Melanonychia      Plan:  Patient was evaluated and treated and all questions answered.  Appears to be benign in nature and currently do not see any signs of proximal spread in the nail fold, it is age-indeterminate and currently is asymptomatic.  She does not have any personal or family history of skin cancer.  We discussed the option of biopsy versus monitoring this.  Currently we will plan to monitor this photographs and measurements were taken and I will see her back in 3 months for follow-ups  Return in about 3 months (around 07/26/2022) for re-evaluate nail pigment.

## 2022-06-01 ENCOUNTER — Inpatient Hospital Stay: Payer: 59 | Attending: Hematology and Oncology

## 2022-06-01 ENCOUNTER — Other Ambulatory Visit: Payer: Self-pay

## 2022-06-01 ENCOUNTER — Inpatient Hospital Stay (HOSPITAL_BASED_OUTPATIENT_CLINIC_OR_DEPARTMENT_OTHER): Payer: 59 | Admitting: Hematology and Oncology

## 2022-06-01 ENCOUNTER — Other Ambulatory Visit: Payer: Self-pay | Admitting: Hematology and Oncology

## 2022-06-01 VITALS — BP 166/72 | HR 77 | Temp 97.5°F | Resp 17 | Wt 148.1 lb

## 2022-06-01 DIAGNOSIS — Z7982 Long term (current) use of aspirin: Secondary | ICD-10-CM | POA: Insufficient documentation

## 2022-06-01 DIAGNOSIS — E1122 Type 2 diabetes mellitus with diabetic chronic kidney disease: Secondary | ICD-10-CM | POA: Insufficient documentation

## 2022-06-01 DIAGNOSIS — E039 Hypothyroidism, unspecified: Secondary | ICD-10-CM | POA: Diagnosis not present

## 2022-06-01 DIAGNOSIS — D649 Anemia, unspecified: Secondary | ICD-10-CM

## 2022-06-01 DIAGNOSIS — D472 Monoclonal gammopathy: Secondary | ICD-10-CM

## 2022-06-01 DIAGNOSIS — I129 Hypertensive chronic kidney disease with stage 1 through stage 4 chronic kidney disease, or unspecified chronic kidney disease: Secondary | ICD-10-CM | POA: Insufficient documentation

## 2022-06-01 DIAGNOSIS — E785 Hyperlipidemia, unspecified: Secondary | ICD-10-CM | POA: Diagnosis not present

## 2022-06-01 DIAGNOSIS — N183 Chronic kidney disease, stage 3 unspecified: Secondary | ICD-10-CM | POA: Insufficient documentation

## 2022-06-01 DIAGNOSIS — Z79899 Other long term (current) drug therapy: Secondary | ICD-10-CM | POA: Insufficient documentation

## 2022-06-01 LAB — CBC WITH DIFFERENTIAL (CANCER CENTER ONLY)
Abs Immature Granulocytes: 0 10*3/uL (ref 0.00–0.07)
Basophils Absolute: 0 10*3/uL (ref 0.0–0.1)
Basophils Relative: 0 %
Eosinophils Absolute: 0.1 10*3/uL (ref 0.0–0.5)
Eosinophils Relative: 3 %
HCT: 29.5 % — ABNORMAL LOW (ref 36.0–46.0)
Hemoglobin: 9.5 g/dL — ABNORMAL LOW (ref 12.0–15.0)
Immature Granulocytes: 0 %
Lymphocytes Relative: 45 %
Lymphs Abs: 1.6 10*3/uL (ref 0.7–4.0)
MCH: 27.9 pg (ref 26.0–34.0)
MCHC: 32.2 g/dL (ref 30.0–36.0)
MCV: 86.8 fL (ref 80.0–100.0)
Monocytes Absolute: 0.4 10*3/uL (ref 0.1–1.0)
Monocytes Relative: 11 %
Neutro Abs: 1.4 10*3/uL — ABNORMAL LOW (ref 1.7–7.7)
Neutrophils Relative %: 41 %
Platelet Count: 149 10*3/uL — ABNORMAL LOW (ref 150–400)
RBC: 3.4 MIL/uL — ABNORMAL LOW (ref 3.87–5.11)
RDW: 13.8 % (ref 11.5–15.5)
WBC Count: 3.5 10*3/uL — ABNORMAL LOW (ref 4.0–10.5)
nRBC: 0 % (ref 0.0–0.2)

## 2022-06-01 LAB — CMP (CANCER CENTER ONLY)
ALT: 21 U/L (ref 0–44)
AST: 22 U/L (ref 15–41)
Albumin: 3.9 g/dL (ref 3.5–5.0)
Alkaline Phosphatase: 55 U/L (ref 38–126)
Anion gap: 6 (ref 5–15)
BUN: 41 mg/dL — ABNORMAL HIGH (ref 8–23)
CO2: 27 mmol/L (ref 22–32)
Calcium: 9.2 mg/dL (ref 8.9–10.3)
Chloride: 111 mmol/L (ref 98–111)
Creatinine: 1.49 mg/dL — ABNORMAL HIGH (ref 0.44–1.00)
GFR, Estimated: 35 mL/min — ABNORMAL LOW (ref >60)
Glucose, Bld: 115 mg/dL — ABNORMAL HIGH (ref 70–99)
Potassium: 3.8 mmol/L (ref 3.5–5.1)
Sodium: 144 mmol/L (ref 135–145)
Total Bilirubin: 0.4 mg/dL (ref 0.3–1.2)
Total Protein: 7.6 g/dL (ref 6.5–8.1)

## 2022-06-01 LAB — LACTATE DEHYDROGENASE: LDH: 193 U/L — ABNORMAL HIGH (ref 98–192)

## 2022-06-02 LAB — KAPPA/LAMBDA LIGHT CHAINS
Kappa free light chain: 58.5 mg/L — ABNORMAL HIGH (ref 3.3–19.4)
Kappa, lambda light chain ratio: 2.1 — ABNORMAL HIGH (ref 0.26–1.65)
Lambda free light chains: 27.8 mg/L — ABNORMAL HIGH (ref 5.7–26.3)

## 2022-06-06 LAB — MULTIPLE MYELOMA PANEL, SERUM
Albumin SerPl Elph-Mcnc: 3.5 g/dL (ref 2.9–4.4)
Albumin/Glob SerPl: 1.1 (ref 0.7–1.7)
Alpha 1: 0.2 g/dL (ref 0.0–0.4)
Alpha2 Glob SerPl Elph-Mcnc: 0.7 g/dL (ref 0.4–1.0)
B-Globulin SerPl Elph-Mcnc: 0.8 g/dL (ref 0.7–1.3)
Gamma Glob SerPl Elph-Mcnc: 1.5 g/dL (ref 0.4–1.8)
Globulin, Total: 3.3 g/dL (ref 2.2–3.9)
IgA: 254 mg/dL (ref 64–422)
IgG (Immunoglobin G), Serum: 1641 mg/dL — ABNORMAL HIGH (ref 586–1602)
IgM (Immunoglobulin M), Srm: 110 mg/dL (ref 26–217)
M Protein SerPl Elph-Mcnc: 0.7 g/dL — ABNORMAL HIGH
Total Protein ELP: 6.8 g/dL (ref 6.0–8.5)

## 2022-06-10 ENCOUNTER — Telehealth: Payer: Self-pay | Admitting: Hematology and Oncology

## 2022-06-10 NOTE — Telephone Encounter (Signed)
Called patient per 11/8 in basket. Patient notified/mail calendar.

## 2022-06-14 NOTE — Progress Notes (Signed)
[facility] [phone]   Fax:([phone]) [place] NOTE  Patient Care Team: Michael Boston, MD as PCP - General (Internal Medicine) Warren Danes, PA-C as Physician Assistant (Dermatology)  CHIEF COMPLAINTS: MGUS  HISTORY OF PRESENTING ILLNESS:  Gloria Bowen 81 y.o. 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  Exam today Gloria Bowen is accompanied by her brother.  She reports she does have some occasional issues with her legs bothering her and she does have some neuropathy and swelling.  She reports that due to the fluids she tries to elevate her legs at night.  She is also increasing her exercise at her senior citizen program.  She reports her appetite is strong and she is eating well at home.  She notes that her energy is quite poor and she gets fatigued often and has to sit down.  She notes that she has not noticed any change in her urine such as the frequency, color, or bubbling.  She notes that she is trying to avoid salt due to the fluid increase in her legs.  Otherwise she has been at her baseline level of health.  Patient denies any fevers, chills, night sweats, chest pain or cough. She has no other complaints. Rest of the 10 point ROS is below.   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    SURGICAL HISTORY: Past Surgical History:  Procedure Laterality Date   THYROIDECTOMY, PARTIAL  1991    SOCIAL HISTORY: Social History   Socioeconomic History   Marital status: Single    Spouse name: Not on file   Number of children: Not on file   Years of education: Not on file   Highest education level: Not on file  Occupational History   Not on file  Tobacco Use   Smoking status: Never   Smokeless tobacco: Never  Substance and  Sexual Activity   Alcohol use: Never   Drug use: Never   Sexual activity: Not on file  Other Topics Concern   Not on file  Social History Narrative   ** Merged History Encounter **       Social Determinants of Health   Financial Resource Strain: Not on file  Food Insecurity: Not on file  Transportation Needs: Not on file  Physical Activity: Not on file  Stress: Not on file  Social Connections: Not on file  Intimate Partner Violence: Not on file    FAMILY HISTORY: Family History  Problem Relation Age of Onset   Breast cancer Sister    Lung cancer Mother        smokeless tobacco use.     ALLERGIES:  is allergic to nabumetone and ramipril.  MEDICATIONS:  Current Outpatient Medications  Medication Sig Dispense Refill   amLODipine (NORVASC) 10 MG tablet Take 10 mg by mouth daily.     aspirin 81 MG tablet Take 81 mg by mouth daily.     atorvastatin (LIPITOR) 20 MG tablet Take 20 mg by mouth daily.     b complex vitamins capsule Take 1 capsule by mouth daily.     doxazosin (CARDURA) 4 MG tablet Take 4 mg by mouth daily.     ferrous sulfate 325 (65 FE) MG tablet Take 325  mg by mouth daily with breakfast.     furosemide (LASIX) 20 MG tablet      gabapentin (NEURONTIN) 300 MG capsule Take by mouth.     levothyroxine (SYNTHROID) 88 MCG tablet      losartan (COZAAR) 100 MG tablet Take 100 mg by mouth daily.     metoprolol succinate (TOPROL-XL) 100 MG 24 hr tablet Take 100 mg by mouth daily.     Potassium Chloride ER 20 MEQ TBCR Take 1 tablet by mouth daily.     WIXELA INHUB 250-50 MCG/DOSE AEPB Inhale 1 puff into the lungs daily.     No current facility-administered medications for this visit.    REVIEW OF SYSTEMS:   Constitutional: ( - ) fevers, ( - )  chills , ( - ) night sweats Eyes: ( - ) blurriness of vision, ( - ) double vision, ( - ) watery eyes Ears, nose, mouth, throat, and face: ( - ) mucositis, ( - ) sore throat Respiratory: ( - ) cough, ( - ) dyspnea, ( - )  wheezes Cardiovascular: ( - ) palpitation, ( - ) chest discomfort, ( - ) lower extremity swelling Gastrointestinal:  ( - ) nausea, ( - ) heartburn, ( - ) change in bowel habits Skin: ( - ) abnormal skin rashes Lymphatics: ( - ) new lymphadenopathy, ( - ) easy bruising Neurological: ( + ) numbness, ( - ) tingling, ( - ) new weaknesses Behavioral/Psych: ( - ) mood change, ( - ) new changes  All other systems were reviewed with the patient and are negative.  PHYSICAL EXAMINATION: ECOG PERFORMANCE STATUS: 0 - Asymptomatic  Vitals:   06/01/22 1116  BP: (!) 166/72  Pulse: 77  Resp: 17  Temp: (!) 97.5 F (36.4 C)  SpO2: 98%   Filed Weights   06/01/22 1116  Weight: 148 lb 1.6 oz (67.2 kg)    GENERAL: well appearing African American female in NAD  SKIN: skin color, texture, turgor are normal, no rashes or significant lesions EYES: conjunctiva are pink and non-injected, sclera clear LUNGS: clear to auscultation and percussion with normal breathing effort HEART: regular rate & rhythm and no murmurs and no lower extremity edema Musculoskeletal: no cyanosis of digits and no clubbing.  PSYCH: alert & oriented x 3, fluent speech NEURO: no focal motor/sensory deficits  LABORATORY DATA:  I have reviewed the data as listed    Latest Ref Rng & Units 06/01/2022    9:33 AM 11/30/2021   12:30 PM 05/25/2021   10:12 AM  CBC  WBC 4.0 - 10.5 K/uL 3.5  3.3  3.4   Hemoglobin 12.0 - 15.0 g/dL 9.5  9.9  9.8   Hematocrit 36.0 - 46.0 % 29.5  31.0  29.9   Platelets 150 - 400 K/uL 149  167  181        Latest Ref Rng & Units 06/01/2022    9:33 AM 11/30/2021   12:30 PM 05/25/2021   10:12 AM  CMP  Glucose 70 - 99 mg/dL 115  191  127   BUN 8 - 23 mg/dL 41  43  29   Creatinine 0.44 - 1.00 mg/dL 1.49  1.59  1.33   Sodium 135 - 145 mmol/L 144  140  141   Potassium 3.5 - 5.1 mmol/L 3.8  3.7  3.8   Chloride 98 - 111 mmol/L 111  103  107   CO2 22 - 32 mmol/L _0 Calcium  8.9 - 10.3 mg/dL 9.2  9.3   9.2   Total Protein 6.5 - 8.1 g/dL 7.6  7.6  7.4   Total Bilirubin 0.3 - 1.2 mg/dL 0.4  0.4  0.4   Alkaline Phos 38 - 126 U/L 55  51  60   AST 15 - 41 U/L _0 ALT 0 - 44 U/L _1 ASSESSMENT & PLAN Gloria Bowen is a 81 y.o. female presenting to the clinic for routine follow up for MGUS.   # IgG Kappa MGUS: --DG bone met survey from 12/08/2020 showed no evidence of lytic lesions. Next one scheduled for 12/03/2021.  --CBC shows stable anemia and neutropenia, Hgb 9.5, white blood cell count 3.5, MCV 86.8, and platelets 149.  ANC 1.4. --Today we discussed bone marrow biopsy.  The patient notes that she would like to hold on this procedure unless there to be any changes in her blood counts or health. --SPEP with IFE, UPEP and serum free light chains from today are pending.  Stable on last assessment. --RTC in 6 months with labs unless pending labs from today require further intervention.  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  --Patient is currently taking ferrous sulfate 325 mg daily as prescribed by PCP.  --Patient denies any signs of bleeding and colonoscopy screens have been deferred due to age. --B12, Folate and Iron were checked on 11/20/2020, all unremarkable except for iron saturation on the low end of normal at 22% and ferritin elevated at 509.    No orders of the defined types were placed in this encounter.   All questions were answered. The patient knows to call the clinic with any problems, questions or concerns.  I have spent a total of 30 minutes minutes of face-to-face and non-face-to-face time, preparing to see the patient, obtaining and/or reviewing separately obtained history, performing a medically appropriate examination, counseling and educating the patient, ordering tests,  documenting clinical information in the electronic health record, and care coordination.    Ledell Peoples, MD Department of  Hematology/Oncology [place] [facility] Phone: [phone] Pager: [phone] Email: [email or web address]  Phone: [phone]

## 2022-07-04 ENCOUNTER — Encounter: Payer: Self-pay | Admitting: Podiatry

## 2022-07-04 ENCOUNTER — Ambulatory Visit (INDEPENDENT_AMBULATORY_CARE_PROVIDER_SITE_OTHER): Payer: 59 | Admitting: Podiatry

## 2022-07-04 VITALS — BP 171/68

## 2022-07-04 DIAGNOSIS — E119 Type 2 diabetes mellitus without complications: Secondary | ICD-10-CM | POA: Diagnosis not present

## 2022-07-04 DIAGNOSIS — M79675 Pain in left toe(s): Secondary | ICD-10-CM | POA: Diagnosis not present

## 2022-07-04 DIAGNOSIS — B351 Tinea unguium: Secondary | ICD-10-CM | POA: Diagnosis not present

## 2022-07-04 DIAGNOSIS — M79674 Pain in right toe(s): Secondary | ICD-10-CM | POA: Diagnosis not present

## 2022-07-04 NOTE — Progress Notes (Unsigned)
  Subjective:  Patient ID: Gloria Bowen, female    DOB: 1940/12/21,  MRN: [id number]  Gloria Bowen presents to clinic today for {jgcomplaint:23593}  Chief Complaint  Patient presents with   Nail Problem    Diabetic foot care BS-do not check A1C-do not know PCP-Wile, Mickel Baas PCP VST- 4 Months ago   New problem(s): None. {jgcomplaint:23593}  PCP is Michael Boston, MD.  Allergies  Allergen Reactions   Nabumetone Other (See Comments)   Ramipril Other (See Comments)    Review of Systems: Negative except as noted in the HPI.  Objective: No changes noted in today's physical examination. Vitals:   07/04/22 0935  BP: (!) 171/68   Gloria Bowen is a pleasant 81 y.o. female {jgbodyhabitus:24098} AAO x 3.  Vascular Examination: Palpable pedal pulses RLE. Diminished pulses LLE. Digital hair absent b/l. No pedal edema b/l. Skin temperature gradient WNL b/l. No varicosities b/l. No edema noted b/l LE.Marland Kitchen  Dermatological Examination: Pedal skin with normal turgor, texture and tone b/l. No open wounds. No interdigital macerations b/l.   Toenails 1-5 b/l thickened, discolored, dystrophic with subungual debris. There is pain on palpation to dorsal aspect of nailplates.   Longitudinal melanonychia of left 2nd toenail with extension of of hyperpigmented .  Neurological Examination: Protective sensation intact with 10 gram monofilament b/l LE. Vibratory sensation intact b/l LE.   Musculoskeletal Examination: Muscle strength 5/5 to all LE muscle groups b/l. 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  Assessment/Plan: 1. Pain due to onychomycosis of toenails of both feet   2. Diabetes mellitus without complication ([place])     No orders of the defined types were placed in this encounter.   None {Jgplan:23602::"-Patient/POA to call should there be question/concern in the interim."}   Return in about 3 months (around 10/03/2022).  Marzetta Board, DPM

## 2022-07-26 ENCOUNTER — Ambulatory Visit: Payer: Medicare Other | Admitting: Podiatry

## 2022-08-17 ENCOUNTER — Ambulatory Visit (INDEPENDENT_AMBULATORY_CARE_PROVIDER_SITE_OTHER): Payer: 59 | Admitting: Podiatry

## 2022-08-17 DIAGNOSIS — L608 Other nail disorders: Secondary | ICD-10-CM | POA: Diagnosis not present

## 2022-08-17 NOTE — Progress Notes (Signed)
  Subjective:  Patient ID: Gloria Bowen, female    DOB: Oct 01, 1940,  MRN: [id number]  Chief Complaint  Patient presents with   Nail Problem    3 months follow up  for re-evaluate nail pigment.    82 y.o. female presents with the above complaint. History confirmed with patient.  She says she has not noticed any change in it so far  Objective:  Physical Exam: warm, good capillary refill, no trophic changes or ulcerative lesions, normal DP and PT pulses, normal sensory exam, and longitudinal melanonychia striata left second toe, less so on the left hallux and right hallux.  No evidence of Hutchinson sign proximally. No change in size appearance or character         Assessment:   1. Melanonychia      Plan:  Patient was evaluated and treated and all questions answered.  So far unchanged in appearance and quality. I advised her to continue to monitor and she will follow up with me if there is enlargement, spread, ulceration or bleeding for biopsy.  Return if symptoms worsen or fail to improve.

## 2022-08-26 ENCOUNTER — Other Ambulatory Visit: Payer: Self-pay | Admitting: Internal Medicine

## 2022-08-26 DIAGNOSIS — N1832 Chronic kidney disease, stage 3b: Secondary | ICD-10-CM

## 2022-08-26 DIAGNOSIS — R809 Proteinuria, unspecified: Secondary | ICD-10-CM

## 2022-10-18 ENCOUNTER — Ambulatory Visit (INDEPENDENT_AMBULATORY_CARE_PROVIDER_SITE_OTHER): Payer: 59 | Admitting: Podiatry

## 2022-10-18 ENCOUNTER — Encounter: Payer: Self-pay | Admitting: Podiatry

## 2022-10-18 VITALS — BP 154/69

## 2022-10-18 DIAGNOSIS — M2011 Hallux valgus (acquired), right foot: Secondary | ICD-10-CM | POA: Diagnosis not present

## 2022-10-18 DIAGNOSIS — M79674 Pain in right toe(s): Secondary | ICD-10-CM

## 2022-10-18 DIAGNOSIS — M79675 Pain in left toe(s): Secondary | ICD-10-CM

## 2022-10-18 DIAGNOSIS — B351 Tinea unguium: Secondary | ICD-10-CM

## 2022-10-18 DIAGNOSIS — E1151 Type 2 diabetes mellitus with diabetic peripheral angiopathy without gangrene: Secondary | ICD-10-CM

## 2022-10-18 DIAGNOSIS — M2012 Hallux valgus (acquired), left foot: Secondary | ICD-10-CM

## 2022-10-18 DIAGNOSIS — L608 Other nail disorders: Secondary | ICD-10-CM

## 2022-10-18 DIAGNOSIS — M2042 Other hammer toe(s) (acquired), left foot: Secondary | ICD-10-CM

## 2022-10-18 DIAGNOSIS — L84 Corns and callosities: Secondary | ICD-10-CM | POA: Diagnosis not present

## 2022-10-18 DIAGNOSIS — M2041 Other hammer toe(s) (acquired), right foot: Secondary | ICD-10-CM | POA: Diagnosis not present

## 2022-10-18 DIAGNOSIS — E119 Type 2 diabetes mellitus without complications: Secondary | ICD-10-CM

## 2022-10-20 NOTE — Progress Notes (Signed)
ANNUAL DIABETIC FOOT EXAM  Subjective: Gloria Bowen presents today for annual diabetic foot examination.  Chief Complaint  Patient presents with   Nail Problem    DFC BS-do not check A1C- can not remember  PCP-Wile, Mickel Baas PCP VST-4 months ago    Patient confirms h/o diabetes.  Patient denies any h/o foot wounds.  Patient denies any numbness, tingling, burning, or pins/needle sensation in feet.  Risk factors: diabetes, HTN, CKD, hyperlipidemia, diabetic renal disease.  Michael Boston, MD is patient's PCP.  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   Normocytic anemia 11/20/2020   Hav (hallux abducto valgus), unspecified laterality 08/07/2020   Hammer toes of both feet 08/07/2020   Pain due to onychomycosis of toenails of both feet 12/06/2019   Diabetes mellitus without complication ([place]) [id number]   Vitamin D deficiency 10/02/2019   Encounter for general adult medical examination without abnormal findings 09/26/2019   Abnormal involuntary movement 08/13/2019   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   Chronic kidney disease due to hypertension 08/13/2019   Chronic kidney disease, stage 3b ([place]) 08/13/2019   Diabetic renal disease ([place]) 08/13/2019   Disorder of bone 08/13/2019   Hyperlipidemia 08/13/2019   Low back pain 08/13/2019   Obesity 08/13/2019   Mild persistent asthma, uncomplicated [id number]   Osteoarthritis 08/13/2019   Personal history of colonic polyps 08/13/2019   Postoperative hypothyroidism 08/13/2019   Proteinuria 08/13/2019   Past Surgical History:  Procedure Laterality Date   THYROIDECTOMY, PARTIAL  1991    Current Outpatient Medications on File Prior to Visit  Medication Sig Dispense Refill   amLODipine (NORVASC) 10 MG tablet Take 10 mg by mouth daily.     aspirin 81 MG tablet Take 81 mg by mouth daily.     atorvastatin (LIPITOR) 20 MG tablet Take 20 mg by mouth daily.     b complex vitamins capsule Take 1 capsule by mouth daily.     doxazosin (CARDURA) 4 MG tablet Take 4 mg by mouth daily.     ferrous sulfate 325 (65 FE) MG tablet Take 325 mg by mouth daily with breakfast.     furosemide (LASIX) 20 MG tablet      gabapentin (NEURONTIN) 300 MG capsule Take by mouth.     levothyroxine (SYNTHROID) 88 MCG tablet      losartan (COZAAR) 100 MG tablet Take 100 mg by mouth daily.     metoprolol succinate (TOPROL-XL) 100 MG 24 hr tablet Take 100 mg by mouth daily.     Potassium Chloride ER 20 MEQ TBCR Take 1 tablet by mouth daily.     WIXELA INHUB 250-50 MCG/DOSE AEPB Inhale 1 puff into the lungs daily.     No current facility-administered medications on file prior to visit.    Allergies  Allergen Reactions   Nabumetone Other (See Comments)   Ramipril Other (See Comments)   Social History   Occupational History   Not on file  Tobacco Use   Smoking status: Never   Smokeless tobacco: Never  Substance and Sexual Activity   Alcohol use: Never   Drug use: Never  Sexual activity: Not on file   Family History  Problem Relation Age of Onset   Breast cancer Sister    Lung cancer Mother        smokeless tobacco use.    Immunization History  Administered Date(s) Administered   PFIZER(Purple Top)SARS-COV-2 Vaccination 06/13/2020     Review of Systems: Negative except as noted in the HPI.   Objective: Vitals:   10/18/22 0903  BP: (!) 154/69    Gloria Bowen is a pleasant 82 y.o. female in NAD. AAO X 3.  Vascular Examination: CFT <3 seconds b/l LE. Palpable DP pulse(s) right lower extremity Palpable PT pulse(s) right lower extremity Nonpalpable pedal pulses LLE. Pedal hair  absent. No pain with calf compression b/l. Lower extremity skin temperature gradient within normal limits. No edema noted b/l LE. No varicosities noted. No cyanosis or clubbing noted b/l LE.  Dermatological Examination: Pedal skin is warm and supple b/l LE. No open wounds b/l LE. No interdigital macerations noted b/l LE. Toenails 1-5 b/l elongated, discolored, dystrophic, thickened, crumbly with subungual debris and tenderness to dorsal palpation. Hyperkeratotic lesion(s) submet head 5 b/l.  No erythema, no edema, no drainage, no fluctuance.  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  Neurological Examination: Protective sensation intact 5/5 intact bilaterally with 10g monofilament b/l. Vibratory sensation intact b/l. Proprioception intact bilaterally.  Musculoskeletal Examination: Normal muscle strength 5/5 to all lower extremity muscle groups bilaterally. HAV with bunion deformity noted b/l LE. Hammertoe(s) noted to the 2-5 bilaterally.. No pain, crepitus or joint limitation noted with ROM b/l LE.  Patient ambulates independently without assistive aids.  Footwear Assessment: Does the patient wear appropriate shoes? Yes. Does the patient need inserts/orthotics? Yes.  ADA Risk Categorization:  High Risk  Patient has one or more of the following: Loss of protective sensation Absent pedal pulses Severe Foot deformity History of foot ulcer  Assessment: 1. Pain due to onychomycosis of toenails of both feet   2. Callus   3. Hallux valgus, acquired, bilateral   4. Hammer toes of both feet   5. Type II diabetes mellitus with peripheral circulatory disorder (HCC)   6. Encounter for diabetic foot exam [facility])    Plan: -Patient was evaluated and treated. All patient's and/or POA's questions/concerns answered on today's visit. -Diabetic foot examination performed today. -Continue diabetic foot care principles: inspect feet daily, monitor glucose as recommended by PCP and/or  Endocrinologist, and follow prescribed diet per PCP, Endocrinologist and/or dietician. -Patient to continue soft, supportive shoe gear daily. -Toenails 1-5 b/l were debrided in length and girth with sterile nail nippers and dremel without iatrogenic bleeding.  -Callus(es) submet head 5 b/l pared utilizing sterile scalpel blade without complication or incident. Total number debrided =2. -Patient/POA to call should there be question/concern in the interim. Return in about 3 months (around 01/18/2023).  Marzetta Board, DPM

## 2022-11-28 ENCOUNTER — Other Ambulatory Visit: Payer: Self-pay | Admitting: Physician Assistant

## 2022-11-28 DIAGNOSIS — D472 Monoclonal gammopathy: Secondary | ICD-10-CM

## 2022-11-29 ENCOUNTER — Inpatient Hospital Stay: Payer: 59 | Attending: Nurse Practitioner

## 2022-11-29 ENCOUNTER — Other Ambulatory Visit: Payer: 59

## 2022-11-29 ENCOUNTER — Inpatient Hospital Stay (HOSPITAL_BASED_OUTPATIENT_CLINIC_OR_DEPARTMENT_OTHER): Payer: 59 | Admitting: Physician Assistant

## 2022-11-29 VITALS — BP 159/69 | HR 67 | Temp 98.0°F | Resp 18 | Ht <= 58 in | Wt 139.7 lb

## 2022-11-29 DIAGNOSIS — D472 Monoclonal gammopathy: Secondary | ICD-10-CM | POA: Diagnosis not present

## 2022-11-29 DIAGNOSIS — Z801 Family history of malignant neoplasm of trachea, bronchus and lung: Secondary | ICD-10-CM | POA: Diagnosis not present

## 2022-11-29 DIAGNOSIS — Z803 Family history of malignant neoplasm of breast: Secondary | ICD-10-CM | POA: Diagnosis not present

## 2022-11-29 DIAGNOSIS — I129 Hypertensive chronic kidney disease with stage 1 through stage 4 chronic kidney disease, or unspecified chronic kidney disease: Secondary | ICD-10-CM | POA: Diagnosis not present

## 2022-11-29 DIAGNOSIS — N183 Chronic kidney disease, stage 3 unspecified: Secondary | ICD-10-CM | POA: Diagnosis not present

## 2022-11-29 DIAGNOSIS — D649 Anemia, unspecified: Secondary | ICD-10-CM | POA: Insufficient documentation

## 2022-11-29 DIAGNOSIS — R600 Localized edema: Secondary | ICD-10-CM | POA: Insufficient documentation

## 2022-11-29 LAB — CBC WITH DIFFERENTIAL (CANCER CENTER ONLY)
Abs Immature Granulocytes: 0 10*3/uL (ref 0.00–0.07)
Basophils Absolute: 0 10*3/uL (ref 0.0–0.1)
Basophils Relative: 1 %
Eosinophils Absolute: 0.1 10*3/uL (ref 0.0–0.5)
Eosinophils Relative: 3 %
HCT: 29.1 % — ABNORMAL LOW (ref 36.0–46.0)
Hemoglobin: 9.6 g/dL — ABNORMAL LOW (ref 12.0–15.0)
Immature Granulocytes: 0 %
Lymphocytes Relative: 46 %
Lymphs Abs: 1.4 10*3/uL (ref 0.7–4.0)
MCH: 28.2 pg (ref 26.0–34.0)
MCHC: 33 g/dL (ref 30.0–36.0)
MCV: 85.3 fL (ref 80.0–100.0)
Monocytes Absolute: 0.5 10*3/uL (ref 0.1–1.0)
Monocytes Relative: 16 %
Neutro Abs: 1 10*3/uL — ABNORMAL LOW (ref 1.7–7.7)
Neutrophils Relative %: 34 %
Platelet Count: 174 10*3/uL (ref 150–400)
RBC: 3.41 MIL/uL — ABNORMAL LOW (ref 3.87–5.11)
RDW: 14.6 % (ref 11.5–15.5)
WBC Count: 3 10*3/uL — ABNORMAL LOW (ref 4.0–10.5)
nRBC: 0 % (ref 0.0–0.2)

## 2022-11-29 LAB — CMP (CANCER CENTER ONLY)
ALT: 21 U/L (ref 0–44)
AST: 23 U/L (ref 15–41)
Albumin: 3.7 g/dL (ref 3.5–5.0)
Alkaline Phosphatase: 73 U/L (ref 38–126)
Anion gap: 5 (ref 5–15)
BUN: 33 mg/dL — ABNORMAL HIGH (ref 8–23)
CO2: 30 mmol/L (ref 22–32)
Calcium: 9.4 mg/dL (ref 8.9–10.3)
Chloride: 107 mmol/L (ref 98–111)
Creatinine: 1.77 mg/dL — ABNORMAL HIGH (ref 0.44–1.00)
GFR, Estimated: 28 mL/min — ABNORMAL LOW (ref >60)
Glucose, Bld: 126 mg/dL — ABNORMAL HIGH (ref 70–99)
Potassium: 3.8 mmol/L (ref 3.5–5.1)
Sodium: 142 mmol/L (ref 135–145)
Total Bilirubin: 0.5 mg/dL (ref 0.3–1.2)
Total Protein: 7 g/dL (ref 6.5–8.1)

## 2022-11-29 NOTE — Progress Notes (Signed)
[facility] Telephone:(336) [phone]   [phone]  HEMATOLOGY AND ONCOLOGY PROGRSS NOTE  Patient Care Team: Melida Quitter, MD as PCP - General (Internal Medicine) Glyn Ade, PA-C as Physician Assistant (Dermatology)  CHIEF COMPLAINTS: MGUS  HISTORY OF PRESENTING ILLNESS:  Gloria Bowen 82 y.o. female returns for a follow up for MGUS.She was last seen by Dr. Leonides Schanz on 06/01/2022. In the interim, she denies any major changes to her health. She is unaccompanied for this visit.   On exam today Gloria Bowen reports she has noticed mild decrease in her energy levels but continues to complete her ADLs on her own. She has a good appetite with overall stable weight. She denies nausea, vomiting or abdominal pain. Her bowel habits are unchanged without recurrent episodes of diarrhea or constipation. She reports chronic low back pain and hip pain that is unchanged. She reports the pain usually presents when she lays down. She is struggling with worsening lower extremity edema that starts below the knees. She was taking lasix twice daily for the last few days with minimal improvement.Otherwise she has been at her baseline level of health.  Patient denies any fevers, chills, night sweats, chest pain or cough. She has no other complaints. Rest of the 10 point ROS is below.   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    SURGICAL HISTORY: Past Surgical History:  Procedure Laterality Date   THYROIDECTOMY, PARTIAL  1991    SOCIAL HISTORY: Social History   Socioeconomic History   Marital status: Single    Spouse name: Not on file   Number of children: Not on file   Years of education: Not on file   Highest education level: Not on file  Occupational History   Not on file   Tobacco Use   Smoking status: Never   Smokeless tobacco: Never  Substance and Sexual Activity   Alcohol use: Never   Drug use: Never   Sexual activity: Not on file  Other Topics Concern   Not on file  Social History Narrative   ** Merged History Encounter **       Social Determinants of Health   Financial Resource Strain: Not on file  Food Insecurity: Not on file  Transportation Needs: Not on file  Physical Activity: Not on file  Stress: Not on file  Social Connections: Not on file  Intimate Partner Violence: Not on file    FAMILY HISTORY: Family History  Problem Relation Age of Onset   Breast cancer Sister    Lung cancer Mother        smokeless tobacco use.     ALLERGIES:  is allergic to nabumetone and ramipril.  MEDICATIONS:  Current Outpatient Medications  Medication Sig Dispense Refill   amLODipine (NORVASC) 10 MG tablet Take 10 mg by mouth daily.     aspirin 81 MG tablet Take 81 mg by mouth daily.     atorvastatin (LIPITOR) 20 MG tablet Take 20 mg by mouth daily.     b complex vitamins capsule Take 1 capsule by mouth daily.     doxazosin (CARDURA) 4 MG tablet Take 4 mg by mouth daily.     ferrous sulfate 325 (65 FE) MG tablet Take 325 mg by mouth daily with  breakfast.     furosemide (LASIX) 20 MG tablet      gabapentin (NEURONTIN) 300 MG capsule Take by mouth.     levothyroxine (SYNTHROID) 88 MCG tablet      losartan (COZAAR) 100 MG tablet Take 100 mg by mouth daily.     metoprolol succinate (TOPROL-XL) 100 MG 24 hr tablet Take 100 mg by mouth daily.     Potassium Chloride ER 20 MEQ TBCR Take 1 tablet by mouth daily.     WIXELA INHUB 250-50 MCG/DOSE AEPB Inhale 1 puff into the lungs daily.     No current facility-administered medications for this visit.    REVIEW OF SYSTEMS:   Constitutional: ( - ) fevers, ( - )  chills , ( - ) night sweats Eyes: ( - ) blurriness of vision, ( - ) double vision, ( - ) watery eyes Ears, nose, mouth, throat, and face: ( -  ) mucositis, ( - ) sore throat Respiratory: ( - ) cough, ( - ) dyspnea, ( - ) wheezes Cardiovascular: ( - ) palpitation, ( - ) chest discomfort, ( - ) lower extremity swelling Gastrointestinal:  ( - ) nausea, ( - ) heartburn, ( - ) change in bowel habits Skin: ( - ) abnormal skin rashes Lymphatics: ( - ) new lymphadenopathy, ( - ) easy bruising Neurological: ( + ) numbness, ( - ) tingling, ( - ) new weaknesses Behavioral/Psych: ( - ) mood change, ( - ) new changes  All other systems were reviewed with the patient and are negative.  PHYSICAL EXAMINATION: ECOG PERFORMANCE STATUS: 0 - Asymptomatic  Vitals:   11/29/22 1022  BP: (!) 159/69  Pulse: 67  Resp: 18  Temp: 98 F (36.7 C)  SpO2: 98%   Filed Weights   11/29/22 1022  Weight: 139 lb 11.2 oz (63.4 kg)    GENERAL: well appearing African American female in NAD  SKIN: skin color, texture, turgor are normal, no rashes or significant lesions EYES: conjunctiva are pink and non-injected, sclera clear LUNGS: clear to auscultation and percussion with normal breathing effort HEART: regular rate & rhythm and no murmurs. 3+ lower extremity edema, b/l Musculoskeletal: no cyanosis of digits and no clubbing.  PSYCH: alert & oriented x 3, fluent speech NEURO: no focal motor/sensory deficits  LABORATORY DATA:  I have reviewed the data as listed    Latest Ref Rng & Units 11/29/2022   10:05 AM 06/01/2022    9:33 AM 11/30/2021   12:30 PM  CBC  WBC 4.0 - 10.5 K/uL 3.0  3.5  3.3   Hemoglobin 12.0 - 15.0 g/dL 9.6  9.5  9.9   Hematocrit 36.0 - 46.0 % 29.1  29.5  31.0   Platelets 150 - 400 K/uL 174  149  167        Latest Ref Rng & Units 11/29/2022   10:05 AM 06/01/2022    9:33 AM 11/30/2021   12:30 PM  CMP  Glucose 70 - 99 mg/dL 161  096  045   BUN 8 - 23 mg/dL 33  41  43   Creatinine 0.44 - 1.00 mg/dL 4.09  8.11  9.14   Sodium 135 - 145 mmol/L 142  144  140   Potassium 3.5 - 5.1 mmol/L 3.8  3.8  3.7   Chloride 98 - 111 mmol/L 107  111   103   CO2 22 - 32 mmol/L 30  27  28    Calcium 8.9 - 10.3 mg/dL 9.4  9.2  9.3   Total Protein 6.5 - 8.1 g/dL 7.0  7.6  7.6   Total Bilirubin 0.3 - 1.2 mg/dL 0.5  0.4  0.4   Alkaline Phos 38 - 126 U/L 73  55  51   AST 15 - 41 U/L 23  22  21    ALT 0 - 44 U/L 21  21  16       ASSESSMENT & PLAN Gloria Bowen is a 82 y.o. female presenting to the clinic for routine follow up for MGUS.   # IgG Kappa MGUS: --DG bone met survey from 5/105/2023 showed showed new 9 mm lucent area in the humeral head. Follow up PET scan from 12/15/2021 showed no abnormal bone lesion. Next bone met survey is scheduled from 12/02/2022.  --Last SPEP from 06/01/2022 showed overall stable M spike measuring 0.7 g/dL. Kappa light chain was 58.5, Lambda light chain 27.8 with ratio of 2.10.  --CBC shows stable anemia and neutropenia, Hgb 9.6, white blood cell count 3.0, MCV 85.3, and platelets 174.  ANC 1.0. Creatinine has increased to 1.77, Calcium normal.  --SPEP with IFE, UPEP and serum free light chains from today are pending.   --Will determine if patient needs to undergo bone marrow biopsy once remaining labs finalized --RTC in 6 months with labs unless pending labs from today require further intervention.  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  --Patient is currently taking ferrous sulfate 325 mg daily as prescribed by PCP.  --Patient denies any signs of bleeding and colonoscopy screens have been deferred due to age. --B12, Folate and Iron were checked on 11/20/2020, all unremarkable except for iron saturation on the low end of normal at 22% and ferritin elevated at 509.   #B/L lower extremity edema:  --etiology unknown but etiology includes medication induced and CKD --patient has a follow up with nephrologist next week --continue with lasix therapy as prescribed.   No orders of the defined types were placed in this encounter.   All questions were answered. The patient knows to call  the clinic with any problems, questions or concerns.  I have spent a total of 30 minutes minutes of face-to-face and non-face-to-face time, preparing to see the patient, obtaining and/or reviewing separately obtained history, performing a medically appropriate examination, counseling and educating the patient, ordering tests,  documenting clinical information in the electronic health record, and care coordination.    Georga Kaufmann PA-C Dept of Hematology and Oncology [facility] Phone: [phone]

## 2022-11-30 LAB — KAPPA/LAMBDA LIGHT CHAINS
Kappa free light chain: 78.1 mg/L — ABNORMAL HIGH (ref 3.3–19.4)
Kappa, lambda light chain ratio: 1.85 — ABNORMAL HIGH (ref 0.26–1.65)
Lambda free light chains: 42.3 mg/L — ABNORMAL HIGH (ref 5.7–26.3)

## 2022-12-02 ENCOUNTER — Inpatient Hospital Stay (HOSPITAL_COMMUNITY): Admission: RE | Admit: 2022-12-02 | Payer: 59 | Source: Ambulatory Visit

## 2022-12-05 LAB — MULTIPLE MYELOMA PANEL, SERUM
Albumin SerPl Elph-Mcnc: 3.1 g/dL (ref 2.9–4.4)
Albumin/Glob SerPl: 0.9 (ref 0.7–1.7)
Alpha 1: 0.3 g/dL (ref 0.0–0.4)
Alpha2 Glob SerPl Elph-Mcnc: 0.7 g/dL (ref 0.4–1.0)
B-Globulin SerPl Elph-Mcnc: 1 g/dL (ref 0.7–1.3)
Gamma Glob SerPl Elph-Mcnc: 1.5 g/dL (ref 0.4–1.8)
Globulin, Total: 3.5 g/dL (ref 2.2–3.9)
IgA: 264 mg/dL (ref 64–422)
IgG (Immunoglobin G), Serum: 1481 mg/dL (ref 586–1602)
IgM (Immunoglobulin M), Srm: 121 mg/dL (ref 26–217)
M Protein SerPl Elph-Mcnc: 0.5 g/dL — ABNORMAL HIGH
Total Protein ELP: 6.6 g/dL (ref 6.0–8.5)

## 2022-12-06 ENCOUNTER — Ambulatory Visit: Payer: 59 | Admitting: Physician Assistant

## 2022-12-14 ENCOUNTER — Other Ambulatory Visit: Payer: Self-pay

## 2022-12-14 ENCOUNTER — Ambulatory Visit (HOSPITAL_COMMUNITY)
Admission: RE | Admit: 2022-12-14 | Discharge: 2022-12-14 | Disposition: A | Discharge status: Home / Self Care | Payer: 59 | Source: Ambulatory Visit | Attending: Physician Assistant | Admitting: Physician Assistant

## 2022-12-14 ENCOUNTER — Other Ambulatory Visit: Payer: Self-pay | Admitting: *Deleted

## 2022-12-14 DIAGNOSIS — D472 Monoclonal gammopathy: Secondary | ICD-10-CM | POA: Insufficient documentation

## 2022-12-14 DIAGNOSIS — D649 Anemia, unspecified: Secondary | ICD-10-CM

## 2022-12-16 LAB — UPEP/TP, 24-HR URINE
Albumin, U: 70.6 %
Alpha 1, Urine: 4 %
Alpha 2, Urine: 4 %
Beta, Urine: 12 %
Gamma Globulin, Urine: 9.4 %
M-Spike, mg/24 hr: 36.4 mg/24 hr — ABNORMAL HIGH
M-spike, %: 2.9 % — ABNORMAL HIGH
Total Protein, Urine-Ur/day: 1257 mg/24 hr — ABNORMAL HIGH (ref 30–150)
Total Protein, Urine: 157.1 mg/dL
Total Volume: 800

## 2022-12-22 ENCOUNTER — Telehealth: Payer: Self-pay

## 2022-12-22 NOTE — Telephone Encounter (Signed)
Can you notify patient that Dr. Leonides Schanz reviewed all the labs/urine and xray results show no major changes. Continue to monitor   Pt advised with VU

## 2022-12-28 ENCOUNTER — Ambulatory Visit
Admission: RE | Admit: 2022-12-28 | Discharge: 2022-12-28 | Disposition: A | Discharge status: Home / Self Care | Payer: 59 | Source: Ambulatory Visit | Attending: Internal Medicine | Admitting: Internal Medicine

## 2022-12-28 DIAGNOSIS — R809 Proteinuria, unspecified: Secondary | ICD-10-CM

## 2022-12-28 DIAGNOSIS — N1832 Chronic kidney disease, stage 3b: Secondary | ICD-10-CM

## 2023-03-01 ENCOUNTER — Ambulatory Visit (INDEPENDENT_AMBULATORY_CARE_PROVIDER_SITE_OTHER): Payer: 59 | Admitting: Podiatry

## 2023-03-01 ENCOUNTER — Encounter: Payer: Self-pay | Admitting: Podiatry

## 2023-03-01 VITALS — BP 129/40

## 2023-03-01 DIAGNOSIS — B351 Tinea unguium: Secondary | ICD-10-CM

## 2023-03-01 DIAGNOSIS — L84 Corns and callosities: Secondary | ICD-10-CM | POA: Diagnosis not present

## 2023-03-01 DIAGNOSIS — M79675 Pain in left toe(s): Secondary | ICD-10-CM | POA: Diagnosis not present

## 2023-03-01 DIAGNOSIS — M79674 Pain in right toe(s): Secondary | ICD-10-CM | POA: Diagnosis not present

## 2023-03-01 DIAGNOSIS — E1151 Type 2 diabetes mellitus with diabetic peripheral angiopathy without gangrene: Secondary | ICD-10-CM | POA: Diagnosis not present

## 2023-03-01 NOTE — Progress Notes (Signed)
  Subjective:  Patient ID: Gloria Bowen, female    DOB: Jul 26, 1941,  MRN: [id number]  Gloria Bowen presents to clinic today for: at risk foot care. Pt has h/o NIDDM with PAD, painful, discolored, thick toenails which interfere with daily activities, and callus(es) both feet and painful thick toenails that are difficult to trim. Painful toenails interfere with ambulation. Aggravating factors include wearing enclosed shoe gear. Pain is relieved with periodic professional debridement. Painful calluses are aggravated when weightbearing with and without shoegear. Pain is relieved with periodic professional debridement.  Chief Complaint  Patient presents with   Nail Problem    dfc    PCP is Melida Quitter, MD.  Allergies  Allergen Reactions   Nabumetone Other (See Comments)   Ramipril Other (See Comments)    Review of Systems: Negative except as noted in the HPI.  Objective: No changes noted in today's physical examination. Vitals:   03/01/23 0936  BP: (!) 129/40    Gloria Bowen is a pleasant 82 y.o. female in NAD. AAO x 3.  Vascular Examination: Capillary refill time <3 seconds b/l LE. Palpable pedal pulses RLE; nonpalpable pedal pulses LLE. Digital hair absent b/l. No pedal edema b/l. Skin temperature gradient WNL b/l. No varicosities b/l. No cyanosis or clubbing noted b/l LE.Marland Kitchen  Dermatological Examination: Pedal skin with normal turgor, texture and tone b/l. No open wounds. No interdigital macerations b/l. Toenails 1-5 b/l thickened, discolored, dystrophic with subungual debris. There is pain on palpation to dorsal aspect of nailplates. Hyperkeratotic lesion(s) submet head 5 b/l.  No erythema, no edema, no drainage, no fluctuance.  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  Neurological Examination: Protective sensation intact with 10 gram monofilament b/l LE. Vibratory sensation intact b/l LE.   Musculoskeletal Examination: Muscle strength 5/5 to  all lower extremity muscle groups bilaterally. 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  Assessment/Plan: 1. Pain due to onychomycosis of toenails of both feet   2. Callus   3. Type II diabetes mellitus with peripheral circulatory disorder (HCC)     -Consent given for treatment as described below: -Examined patient. -Continue diabetic foot care principles: inspect feet daily, monitor glucose as recommended by PCP and/or Endocrinologist, and follow prescribed diet per PCP, Endocrinologist and/or dietician. -Mycotic toenails 1-5 bilaterally were debrided in length and girth with sterile nail nippers and dremel without incident. -Callus(es) submet head 5 b/l pared utilizing sterile scalpel blade without complication or incident. Total number debrided =2. -Patient/POA to call should there be question/concern in the interim.   Return in about 3 months (around 06/01/2023).  Freddie Breech, DPM

## 2023-05-24 ENCOUNTER — Other Ambulatory Visit: Payer: Self-pay

## 2023-05-24 DIAGNOSIS — D472 Monoclonal gammopathy: Secondary | ICD-10-CM

## 2023-05-26 ENCOUNTER — Other Ambulatory Visit: Payer: Self-pay

## 2023-05-26 ENCOUNTER — Inpatient Hospital Stay: Payer: 59 | Attending: Internal Medicine

## 2023-05-26 ENCOUNTER — Inpatient Hospital Stay (HOSPITAL_BASED_OUTPATIENT_CLINIC_OR_DEPARTMENT_OTHER): Payer: 59 | Admitting: Physician Assistant

## 2023-05-26 VITALS — BP 136/58 | HR 71 | Temp 97.5°F | Resp 18 | Wt 137.5 lb

## 2023-05-26 DIAGNOSIS — D472 Monoclonal gammopathy: Secondary | ICD-10-CM

## 2023-05-26 DIAGNOSIS — Z803 Family history of malignant neoplasm of breast: Secondary | ICD-10-CM | POA: Diagnosis not present

## 2023-05-26 DIAGNOSIS — D649 Anemia, unspecified: Secondary | ICD-10-CM | POA: Diagnosis not present

## 2023-05-26 DIAGNOSIS — Z79899 Other long term (current) drug therapy: Secondary | ICD-10-CM | POA: Insufficient documentation

## 2023-05-26 DIAGNOSIS — Z801 Family history of malignant neoplasm of trachea, bronchus and lung: Secondary | ICD-10-CM | POA: Diagnosis not present

## 2023-05-26 LAB — CMP (CANCER CENTER ONLY)
ALT: 22 U/L (ref 0–44)
AST: 25 U/L (ref 15–41)
Albumin: 3.6 g/dL (ref 3.5–5.0)
Alkaline Phosphatase: 60 U/L (ref 38–126)
Anion gap: 6 (ref 5–15)
BUN: 35 mg/dL — ABNORMAL HIGH (ref 8–23)
CO2: 30 mmol/L (ref 22–32)
Calcium: 9.2 mg/dL (ref 8.9–10.3)
Chloride: 106 mmol/L (ref 98–111)
Creatinine: 1.74 mg/dL — ABNORMAL HIGH (ref 0.44–1.00)
GFR, Estimated: 29 mL/min — ABNORMAL LOW (ref >60)
Glucose, Bld: 122 mg/dL — ABNORMAL HIGH (ref 70–99)
Potassium: 3.9 mmol/L (ref 3.5–5.1)
Sodium: 142 mmol/L (ref 135–145)
Total Bilirubin: 0.4 mg/dL (ref 0.3–1.2)
Total Protein: 7 g/dL (ref 6.5–8.1)

## 2023-05-26 LAB — CBC WITH DIFFERENTIAL (CANCER CENTER ONLY)
Abs Immature Granulocytes: 0 10*3/uL (ref 0.00–0.07)
Basophils Absolute: 0 10*3/uL (ref 0.0–0.1)
Basophils Relative: 0 %
Eosinophils Absolute: 0.2 10*3/uL (ref 0.0–0.5)
Eosinophils Relative: 5 %
HCT: 31.6 % — ABNORMAL LOW (ref 36.0–46.0)
Hemoglobin: 10 g/dL — ABNORMAL LOW (ref 12.0–15.0)
Immature Granulocytes: 0 %
Lymphocytes Relative: 40 %
Lymphs Abs: 1.3 10*3/uL (ref 0.7–4.0)
MCH: 28.2 pg (ref 26.0–34.0)
MCHC: 31.6 g/dL (ref 30.0–36.0)
MCV: 89.3 fL (ref 80.0–100.0)
Monocytes Absolute: 0.8 10*3/uL (ref 0.1–1.0)
Monocytes Relative: 25 %
Neutro Abs: 1 10*3/uL — ABNORMAL LOW (ref 1.7–7.7)
Neutrophils Relative %: 30 %
Platelet Count: 157 10*3/uL (ref 150–400)
RBC: 3.54 MIL/uL — ABNORMAL LOW (ref 3.87–5.11)
RDW: 13.5 % (ref 11.5–15.5)
WBC Count: 3.3 10*3/uL — ABNORMAL LOW (ref 4.0–10.5)
nRBC: 0 % (ref 0.0–0.2)

## 2023-05-26 NOTE — Progress Notes (Signed)
[facility] Telephone:([phone]   [phone]  HEMATOLOGY AND ONCOLOGY PROGRSS NOTE  Patient Care Team: Melida Quitter, MD as PCP - General (Internal Medicine) Glyn Ade, PA-C as Physician Assistant (Dermatology)  CHIEF COMPLAINTS: MGUS  HISTORY OF PRESENTING ILLNESS:  Gloria Bowen 82 y.o. female returns for a follow up for MGUS.She was last seen by Dr. Leonides Schanz on 11/29/2022. In the interim, she denies any major changes to her health. She is unaccompanied for this visit.   On exam today Gloria Bowen reports her energy and appetite are stable. She is able to complete her baseline ADLs on her own. She denies nausea, vomiting or bowel habit changes. She continues have low back pain and hip pain that is unchanged. She denies easy bruising or signs of active bleeding. Patient denies any fevers, chills, night sweats, chest pain or cough. She has no other complaints. Rest of the 10 point ROS is below.   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    SURGICAL HISTORY: Past Surgical History:  Procedure Laterality Date   THYROIDECTOMY, PARTIAL  1991    SOCIAL HISTORY: Social History   Socioeconomic History   Marital status: Single    Spouse name: Not on file   Number of children: Not on file   Years of education: Not on file   Highest education level: Not on file  Occupational History   Not on file  Tobacco Use   Smoking status: Never   Smokeless tobacco: Never  Substance and Sexual Activity   Alcohol use: Never   Drug use: Never   Sexual activity: Not on file  Other Topics Concern   Not on file  Social History Narrative   ** Merged History Encounter **       Social Determinants of Health   Financial Resource Strain: Not on file  Food  Insecurity: Not on file  Transportation Needs: Not on file  Physical Activity: Not on file  Stress: Not on file  Social Connections: Not on file  Intimate Partner Violence: Not on file    FAMILY HISTORY: Family History  Problem Relation Age of Onset   Breast cancer Sister    Lung cancer Mother        smokeless tobacco use.     ALLERGIES:  is allergic to nabumetone and ramipril.  MEDICATIONS:  Current Outpatient Medications  Medication Sig Dispense Refill   amLODipine (NORVASC) 10 MG tablet Take 10 mg by mouth daily.     aspirin 81 MG tablet Take 81 mg by mouth daily.     atorvastatin (LIPITOR) 20 MG tablet Take 20 mg by mouth daily.     b complex vitamins capsule Take 1 capsule by mouth daily.     doxazosin (CARDURA) 4 MG tablet Take 4 mg by mouth daily.     ferrous sulfate 325 (65 FE) MG tablet Take 325 mg by mouth daily with breakfast.     furosemide (LASIX) 20 MG tablet      gabapentin (NEURONTIN) 300 MG capsule Take by mouth.     levothyroxine (SYNTHROID) 88 MCG tablet      losartan (COZAAR) 100 MG tablet Take 100 mg by mouth daily.     metoprolol succinate (TOPROL-XL) 100 MG 24 hr  tablet Take 100 mg by mouth daily.     Potassium Chloride ER 20 MEQ TBCR Take 1 tablet by mouth daily.     WIXELA INHUB 250-50 MCG/DOSE AEPB Inhale 1 puff into the lungs daily.     No current facility-administered medications for this visit.    REVIEW OF SYSTEMS:   Constitutional: ( - ) fevers, ( - )  chills , ( - ) night sweats Eyes: ( - ) blurriness of vision, ( - ) double vision, ( - ) watery eyes Ears, nose, mouth, throat, and face: ( - ) mucositis, ( - ) sore throat Respiratory: ( - ) cough, ( - ) dyspnea, ( - ) wheezes Cardiovascular: ( - ) palpitation, ( - ) chest discomfort, ( - ) lower extremity swelling Gastrointestinal:  ( - ) nausea, ( - ) heartburn, ( - ) change in bowel habits Skin: ( - ) abnormal skin rashes Lymphatics: ( - ) new lymphadenopathy, ( - ) easy  bruising Neurological: ( + ) numbness, ( - ) tingling, ( - ) new weaknesses Behavioral/Psych: ( - ) mood change, ( - ) new changes  All other systems were reviewed with the patient and are negative.  PHYSICAL EXAMINATION: ECOG PERFORMANCE STATUS: 0 - Asymptomatic  Vitals:   05/26/23 1115  BP: (!) 136/58  Pulse: 71  Resp: 18  Temp: (!) 97.5 F (36.4 C)  SpO2: 100%   Filed Weights   05/26/23 1115  Weight: 137 lb 8 oz (62.4 kg)    GENERAL: well appearing African American female in NAD  SKIN: skin color, texture, turgor are normal, no rashes or significant lesions EYES: conjunctiva are pink and non-injected, sclera clear LUNGS: clear to auscultation and percussion with normal breathing effort HEART: regular rate & rhythm and no murmurs. No lower extremity edema Musculoskeletal: no cyanosis of digits and no clubbing.  PSYCH: alert & oriented x 3, fluent speech NEURO: no focal motor/sensory deficits  LABORATORY DATA:  I have reviewed the data as listed    Latest Ref Rng & Units 05/26/2023   10:43 AM 11/29/2022   10:05 AM 06/01/2022    9:33 AM  CBC  WBC 4.0 - 10.5 K/uL 3.3  3.0  3.5   Hemoglobin 12.0 - 15.0 g/dL 13.0  9.6  9.5   Hematocrit 36.0 - 46.0 % 31.6  29.1  29.5   Platelets 150 - 400 K/uL 157  174  149        Latest Ref Rng & Units 05/26/2023   10:43 AM 11/29/2022   10:05 AM 06/01/2022    9:33 AM  CMP  Glucose 70 - 99 mg/dL 865  784  696   BUN 8 - 23 mg/dL 35  33  41   Creatinine 0.44 - 1.00 mg/dL 2.95  2.84  1.32   Sodium 135 - 145 mmol/L 142  142  144   Potassium 3.5 - 5.1 mmol/L 3.9  3.8  3.8   Chloride 98 - 111 mmol/L 106  107  111   CO2 22 - 32 mmol/L 30  30  27    Calcium 8.9 - 10.3 mg/dL 9.2  9.4  9.2   Total Protein 6.5 - 8.1 g/dL 7.0  7.0  7.6   Total Bilirubin 0.3 - 1.2 mg/dL 0.4  0.5  0.4   Alkaline Phos 38 - 126 U/L 60  73  55   AST 15 - 41 U/L 25  23  22    ALT 0 -  44 U/L 22  21  21       ASSESSMENT & PLAN Gloria Bowen is a 82 y.o.  female presenting to the clinic for routine follow up for MGUS.   # IgG Kappa MGUS: --DG bone met survey from 12/14/2022 showed showed new 9 mm lucent area in the humeral head. Follow up PET scan from 12/15/2021 showed no abnormal bone lesion. Repeat bone met survey from 12/14/2022 showed new nonspecific small 0.8 cm lucent lesion in the superior left humeral head which could be degenerative versus lytic lesion. Continue to monitor and repeat in one year, May 2025.  --Last SPEP from 11/29/2022 showed M spike measuring 0.5 g/dL. Kappa light chain was trending up to 78.1, Lambda light chain 42.3 with ratio of 1.85 --Last 24 hour UPEP from 12/14/2022 measured M spike 36.4 mg/24 hr. Repeat in one year, May 2025.  PLAN: --Labs today reviewed with patient. CBC shows stable anemia and neutropenia, Hgb 10.0, white blood cell count 3.3,  ANC 1.0, Plt 157K. Creatinine stable at 1.74, Calcium normal.  --SPEP with IFE, UPEP and serum free light chains from today are pending.   --Will determine if patient needs to undergo bone marrow biopsy once remaining labs finalized --RTC in 6 months with labs unless pending labs from today require further intervention.  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  --Patient is currently taking ferrous sulfate 325 mg daily as prescribed by PCP.  --Patient denies any signs of bleeding and colonoscopy screens have been deferred due to age. --B12, Folate and Iron were checked on 11/20/2020, all unremarkable except for iron saturation on the low end of normal at 22% and ferritin elevated at 509.    No orders of the defined types were placed in this encounter.   All questions were answered. The patient knows to call the clinic with any problems, questions or concerns.  I have spent a total of 25 minutes minutes of face-to-face and non-face-to-face time, preparing to see the patient, obtaining and/or reviewing separately obtained history, performing a  medically appropriate examination, counseling and educating the patient,  documenting clinical information in the electronic health record, and care coordination.    Georga Kaufmann PA-C Dept of Hematology and Oncology [facility] Phone: [phone]

## 2023-05-29 LAB — KAPPA/LAMBDA LIGHT CHAINS
Kappa free light chain: 66.5 mg/L — ABNORMAL HIGH (ref 3.3–19.4)
Kappa, lambda light chain ratio: 1.69 — ABNORMAL HIGH (ref 0.26–1.65)
Lambda free light chains: 39.3 mg/L — ABNORMAL HIGH (ref 5.7–26.3)

## 2023-05-31 ENCOUNTER — Other Ambulatory Visit: Payer: 59

## 2023-05-31 ENCOUNTER — Ambulatory Visit: Payer: 59 | Admitting: Physician Assistant

## 2023-05-31 LAB — MULTIPLE MYELOMA PANEL, SERUM
Albumin SerPl Elph-Mcnc: 3.1 g/dL (ref 2.9–4.4)
Albumin/Glob SerPl: 1 (ref 0.7–1.7)
Alpha 1: 0.2 g/dL (ref 0.0–0.4)
Alpha2 Glob SerPl Elph-Mcnc: 0.8 g/dL (ref 0.4–1.0)
B-Globulin SerPl Elph-Mcnc: 0.9 g/dL (ref 0.7–1.3)
Gamma Glob SerPl Elph-Mcnc: 1.3 g/dL (ref 0.4–1.8)
Globulin, Total: 3.3 g/dL (ref 2.2–3.9)
IgA: 256 mg/dL (ref 64–422)
IgG (Immunoglobin G), Serum: 1477 mg/dL (ref 586–1602)
IgM (Immunoglobulin M), Srm: 97 mg/dL (ref 26–217)
M Protein SerPl Elph-Mcnc: 0.7 g/dL — ABNORMAL HIGH
Total Protein ELP: 6.4 g/dL (ref 6.0–8.5)

## 2023-06-07 ENCOUNTER — Telehealth: Payer: Self-pay

## 2023-06-07 NOTE — Telephone Encounter (Signed)
-----   Message from Pollyann Samples sent at 06/07/2023  3:04 PM EST ----- Please let pt know MM/light chains are stable over time. I recommend to continue monitoring, appts are scheduled.   Thanks Lacie NP

## 2023-06-07 NOTE — Telephone Encounter (Signed)
Pt advised of lab results.  April appt with Georga Kaufmann, PA confirmed with pt

## 2023-07-04 ENCOUNTER — Ambulatory Visit: Payer: 59 | Admitting: Podiatry

## 2023-07-19 ENCOUNTER — Ambulatory Visit: Payer: 59 | Admitting: Podiatry

## 2023-07-19 ENCOUNTER — Encounter: Payer: Self-pay | Admitting: Podiatry

## 2023-07-19 VITALS — Ht <= 58 in | Wt 137.5 lb

## 2023-07-19 DIAGNOSIS — L84 Corns and callosities: Secondary | ICD-10-CM | POA: Diagnosis not present

## 2023-07-19 DIAGNOSIS — B351 Tinea unguium: Secondary | ICD-10-CM

## 2023-07-19 DIAGNOSIS — M79674 Pain in right toe(s): Secondary | ICD-10-CM

## 2023-07-19 DIAGNOSIS — M79675 Pain in left toe(s): Secondary | ICD-10-CM | POA: Diagnosis not present

## 2023-07-19 DIAGNOSIS — E1151 Type 2 diabetes mellitus with diabetic peripheral angiopathy without gangrene: Secondary | ICD-10-CM

## 2023-07-27 NOTE — Progress Notes (Signed)
  Subjective:  Patient ID: Gloria Bowen, female    DOB: 1940/10/17,  MRN: [id number]  Gloria Bowen presents to clinic today for at risk foot care. Pt has h/o NIDDM with PAD and callus(es) of both feet and painful thick toenails that are difficult to trim. Painful toenails interfere with ambulation. Aggravating factors include wearing enclosed shoe gear. Pain is relieved with periodic professional debridement. Painful calluses are aggravated when weightbearing with and without shoegear. Pain is relieved with periodic professional debridement.  Chief Complaint  Patient presents with   Nail Problem    Pt is here for [facility] unsure of last A1C PCP is Dr Nadene Rubins and LOV was last month.   New problem(s): None.   PCP is Melida Quitter, MD.  Allergies  Allergen Reactions   Nabumetone Other (See Comments)   Ramipril Other (See Comments)    Review of Systems: Negative except as noted in the HPI.  Objective: No changes noted in today's physical examination. There were no vitals filed for this visit. Gloria Bowen is a pleasant 82 y.o. female WD, WN in NAD. AAO x 3.  Vascular Examination: CFT <3 seconds b/l LE. Palpable DP pulse(s) right foot Palpable PT pulse(s) right foot Nonpalpable pedal pulses LLE. Pedal hair absent. No pain with calf compression b/l. No edema noted b/l LE. No cyanosis or clubbing noted b/l LE.  Dermatological Examination: Pedal skin with normal turgor, texture and tone b/l. No open wounds. No interdigital macerations b/l. Toenails 1-5 b/l thickened, discolored, dystrophic with subungual debris. There is pain on palpation to dorsal aspect of nailplates. Hyperkeratotic lesion(s) submet head 5 b/l.  No erythema, no edema, no drainage, no fluctuance.  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  Neurological Examination: Protective sensation intact with 10 gram monofilament b/l LE. Vibratory sensation intact b/l LE.   Musculoskeletal  Examination: Muscle strength 5/5 to all lower extremity muscle groups bilaterally. 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  Assessment/Plan: 1. Pain due to onychomycosis of toenails of both feet   2. Callus   3. Type II diabetes mellitus with peripheral circulatory disorder (HCC)     -Consent given for treatment as described below: -Examined patient. -Patient to continue soft, supportive shoe gear daily. -Toenails 1-5 b/l were debrided in length and girth with sterile nail nippers and dremel without iatrogenic bleeding.  -Callus(es) submet head 5 b/l pared utilizing sterile scalpel blade without complication or incident. Total number debrided =2. -Patient/POA to call should there be question/concern in the interim.   Return in about 3 months (around 10/17/2023).  Gloria Bowen, DPM      [place] LOCATION: [address], [place] [address]                   Office [phone]   [facility] LOCATION: [address] [place], [place] [address] Office [phone]

## 2023-11-10 ENCOUNTER — Inpatient Hospital Stay: Payer: 59 | Attending: Internal Medicine

## 2023-11-10 ENCOUNTER — Other Ambulatory Visit: Payer: Self-pay

## 2023-11-10 ENCOUNTER — Other Ambulatory Visit: Payer: Self-pay | Admitting: Hematology and Oncology

## 2023-11-10 DIAGNOSIS — D472 Monoclonal gammopathy: Secondary | ICD-10-CM | POA: Insufficient documentation

## 2023-11-10 LAB — CMP (CANCER CENTER ONLY)
ALT: 29 U/L (ref 0–44)
AST: 27 U/L (ref 15–41)
Albumin: 3.9 g/dL (ref 3.5–5.0)
Alkaline Phosphatase: 63 U/L (ref 38–126)
Anion gap: 3 — ABNORMAL LOW (ref 5–15)
BUN: 46 mg/dL — ABNORMAL HIGH (ref 8–23)
CO2: 27 mmol/L (ref 22–32)
Calcium: 8.9 mg/dL (ref 8.9–10.3)
Chloride: 113 mmol/L — ABNORMAL HIGH (ref 98–111)
Creatinine: 2.15 mg/dL — ABNORMAL HIGH (ref 0.44–1.00)
GFR, Estimated: 22 mL/min — ABNORMAL LOW (ref >60)
Glucose, Bld: 114 mg/dL — ABNORMAL HIGH (ref 70–99)
Potassium: 5 mmol/L (ref 3.5–5.1)
Sodium: 143 mmol/L (ref 135–145)
Total Bilirubin: 0.2 mg/dL (ref 0.0–1.2)
Total Protein: 7.3 g/dL (ref 6.5–8.1)

## 2023-11-10 LAB — CBC WITH DIFFERENTIAL (CANCER CENTER ONLY)
Abs Immature Granulocytes: 0.01 10*3/uL (ref 0.00–0.07)
Basophils Absolute: 0 10*3/uL (ref 0.0–0.1)
Basophils Relative: 0 %
Eosinophils Absolute: 0.1 10*3/uL (ref 0.0–0.5)
Eosinophils Relative: 2 %
HCT: 30.5 % — ABNORMAL LOW (ref 36.0–46.0)
Hemoglobin: 9.8 g/dL — ABNORMAL LOW (ref 12.0–15.0)
Immature Granulocytes: 0 %
Lymphocytes Relative: 45 %
Lymphs Abs: 2 10*3/uL (ref 0.7–4.0)
MCH: 29.1 pg (ref 26.0–34.0)
MCHC: 32.1 g/dL (ref 30.0–36.0)
MCV: 90.5 fL (ref 80.0–100.0)
Monocytes Absolute: 0.5 10*3/uL (ref 0.1–1.0)
Monocytes Relative: 11 %
Neutro Abs: 1.9 10*3/uL (ref 1.7–7.7)
Neutrophils Relative %: 42 %
Platelet Count: 171 10*3/uL (ref 150–400)
RBC: 3.37 MIL/uL — ABNORMAL LOW (ref 3.87–5.11)
RDW: 12.9 % (ref 11.5–15.5)
WBC Count: 4.6 10*3/uL (ref 4.0–10.5)
nRBC: 0 % (ref 0.0–0.2)

## 2023-11-10 LAB — LACTATE DEHYDROGENASE: LDH: 175 U/L (ref 98–192)

## 2023-11-13 LAB — KAPPA/LAMBDA LIGHT CHAINS
Kappa free light chain: 55.6 mg/L — ABNORMAL HIGH (ref 3.3–19.4)
Kappa, lambda light chain ratio: 1.5 (ref 0.26–1.65)
Lambda free light chains: 37 mg/L — ABNORMAL HIGH (ref 5.7–26.3)

## 2023-11-13 LAB — MULTIPLE MYELOMA PANEL, SERUM
Albumin SerPl Elph-Mcnc: 3.3 g/dL (ref 2.9–4.4)
Albumin/Glob SerPl: 1 (ref 0.7–1.7)
Alpha 1: 0.2 g/dL (ref 0.0–0.4)
Alpha2 Glob SerPl Elph-Mcnc: 0.8 g/dL (ref 0.4–1.0)
B-Globulin SerPl Elph-Mcnc: 0.9 g/dL (ref 0.7–1.3)
Gamma Glob SerPl Elph-Mcnc: 1.4 g/dL (ref 0.4–1.8)
Globulin, Total: 3.4 g/dL (ref 2.2–3.9)
IgA: 236 mg/dL (ref 64–422)
IgG (Immunoglobin G), Serum: 1576 mg/dL (ref 586–1602)
IgM (Immunoglobulin M), Srm: 95 mg/dL (ref 26–217)
M Protein SerPl Elph-Mcnc: 0.7 g/dL — ABNORMAL HIGH
Total Protein ELP: 6.7 g/dL (ref 6.0–8.5)

## 2023-11-24 ENCOUNTER — Ambulatory Visit: Payer: 59 | Admitting: Physician Assistant

## 2023-11-28 ENCOUNTER — Ambulatory Visit (INDEPENDENT_AMBULATORY_CARE_PROVIDER_SITE_OTHER): Payer: 59 | Admitting: Podiatry

## 2023-11-28 ENCOUNTER — Encounter: Payer: Self-pay | Admitting: Podiatry

## 2023-11-28 VITALS — Ht <= 58 in | Wt 137.0 lb

## 2023-11-28 DIAGNOSIS — M79674 Pain in right toe(s): Secondary | ICD-10-CM

## 2023-11-28 DIAGNOSIS — E1151 Type 2 diabetes mellitus with diabetic peripheral angiopathy without gangrene: Secondary | ICD-10-CM

## 2023-11-28 DIAGNOSIS — B351 Tinea unguium: Secondary | ICD-10-CM | POA: Diagnosis not present

## 2023-11-28 DIAGNOSIS — M79675 Pain in left toe(s): Secondary | ICD-10-CM

## 2023-11-28 DIAGNOSIS — E119 Type 2 diabetes mellitus without complications: Secondary | ICD-10-CM

## 2023-11-28 DIAGNOSIS — M2041 Other hammer toe(s) (acquired), right foot: Secondary | ICD-10-CM | POA: Diagnosis not present

## 2023-11-28 DIAGNOSIS — M2042 Other hammer toe(s) (acquired), left foot: Secondary | ICD-10-CM

## 2023-11-28 DIAGNOSIS — M2012 Hallux valgus (acquired), left foot: Secondary | ICD-10-CM

## 2023-11-28 DIAGNOSIS — L84 Corns and callosities: Secondary | ICD-10-CM | POA: Diagnosis not present

## 2023-11-28 DIAGNOSIS — M2011 Hallux valgus (acquired), right foot: Secondary | ICD-10-CM | POA: Diagnosis not present

## 2023-11-28 NOTE — Progress Notes (Signed)
 ANNUAL DIABETIC FOOT EXAM  Subjective: Gloria Bowen presents today for annual diabetic foot exam.  Chief Complaint  Patient presents with   Diabetes    Patient is here for RFC, last A1C: unknown   Patient confirms h/o diabetes.  Patient denies any h/o foot wounds.  Azalia Leo, MD is patient's PCP.  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/20/2020   Normocytic anemia 11/20/2020   Hav (hallux abducto valgus), unspecified laterality 08/07/2020   Hammer toes of both feet 08/07/2020   Pain due to onychomycosis of toenails of both feet 12/06/2019   Diabetes mellitus without complication (HCC) 12/06/2019   Vitamin D deficiency 10/02/2019   Encounter for general adult medical examination without abnormal findings 09/26/2019   Abnormal involuntary movement 08/13/2019   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   Chronic kidney disease due to hypertension 08/13/2019   Chronic kidney disease, stage 3b (HCC) 08/13/2019   Diabetic renal disease (HCC) 08/13/2019   Disorder of bone 08/13/2019   Hyperlipidemia 08/13/2019   Low back pain 08/13/2019   Obesity 08/13/2019   Mild persistent asthma, uncomplicated 08/13/2019   Osteoarthritis 08/13/2019   History of colonic polyps 08/13/2019   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     aspirin 81 MG tablet Take 81 mg by mouth daily.      atorvastatin (LIPITOR) 20 MG tablet Take 20 mg by mouth daily.     b complex vitamins capsule Take 1 capsule by mouth daily.     doxazosin (CARDURA) 4 MG tablet Take 4 mg by mouth daily.     ferrous sulfate 325 (65 FE) MG tablet Take 325 mg by mouth daily with breakfast.     furosemide (LASIX) 20 MG tablet  (Patient not taking: Reported on 12/01/2023)     gabapentin (NEURONTIN) 300 MG capsule Take by mouth.     levothyroxine (SYNTHROID) 88 MCG tablet      losartan (COZAAR) 100 MG tablet Take 100 mg by mouth daily.     metoprolol succinate (TOPROL-XL) 100 MG 24 hr tablet Take 100 mg by mouth daily.     Potassium Chloride ER 20 MEQ TBCR Take 1 tablet by mouth daily.     WIXELA INHUB 250-50 MCG/DOSE AEPB Inhale 1 puff into the lungs daily.     No current facility-administered medications on file prior to visit.    Allergies  Allergen Reactions   Nabumetone Other (See Comments)   Ramipril Other (See Comments)   Social History   Occupational History   Not on file  Tobacco Use   Smoking status: Never   Smokeless tobacco: Never  Substance and Sexual Activity   Alcohol use: Never   Drug use: Never   Sexual activity: Not on file   Family History  Problem Relation Age of Onset   Breast cancer Sister    Lung  cancer Mother        smokeless tobacco use.    Immunization History  Administered Date(s) Administered   PFIZER(Purple Top)SARS-COV-2 Vaccination 06/13/2020     Review of Systems: Negative except as noted in the HPI.   Objective: There were no vitals filed for this visit.  Gloria Bowen is a pleasant 83 y.o. female in NAD. AAO X 3.  Diabetic foot exam was performed with the following findings:   Vascular Examination: CFT less than 3 seconds. Pedal pulses RLE palpable and nonpalpable pulses LLE. Digital hair absent. Skin temperature gradient warm to warn b/l. No ischemia or gangrene. No cyanosis or clubbing noted b/l.    Neurological Examination: Sensation grossly  intact b/l with 10 gram monofilament. Vibratory sensation intact b/l.   Dermatological Examination: Pedal skin with normal turgor, texture and tone b/l. No open wounds. No interdigital macerations b/l. Toenails 1-5 b/l thickened, discolored, dystrophic with subungual debris. There is pain on palpation to dorsal aspect of nailplates. Hyperkeratotic lesion(s) submet head 5 b/l.  No erythema, no edema, no drainage, no fluctuance.  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  Musculoskeletal Examination: Muscle strength 5/5 to all lower extremity muscle groups bilaterally. HAV with bunion deformity noted b/l LE. Hammertoe deformity noted 2-5 b/l.  Radiographs: None     ADA Risk Categorization: High Risk  Patient has one or more of the following: Loss of protective sensation Absent pedal pulses Severe Foot deformity History of foot ulcer  Assessment: 1. Pain due to onychomycosis of toenails of both feet   2. Callus   3. Type II diabetes mellitus with peripheral circulatory disorder (HCC)   4. Hallux valgus, acquired, bilateral   5. Hammer toes of both feet   6. Encounter for diabetic foot exam (HCC)     Plan: Diabetic foot examination performed today. All patient's and/or POA's questions/concerns addressed on today's visit. Toenails 1-5 debrided in length and girth without incident. Callus(es) submet head 5 b/l pared with sharp debridement without incident. Continue daily foot inspections and monitor blood glucose per PCP/Endocrinologist's recommendations.Continue soft, supportive shoe gear daily. Report any pedal injuries to medical professional. Call office if there are any questions/concerns. Return in about 3 months (around 02/27/2024).  Luella Sager, DPM      [place] LOCATION: [address], [place] [address]                   Office [phone]   [facility] LOCATION: [address] [place], [place] [address] Office [phone]

## 2023-12-01 ENCOUNTER — Inpatient Hospital Stay: Attending: Physician Assistant | Admitting: Physician Assistant

## 2023-12-01 VITALS — BP 169/60 | HR 71 | Temp 97.3°F | Resp 16 | Ht <= 58 in | Wt 134.1 lb

## 2023-12-01 DIAGNOSIS — D472 Monoclonal gammopathy: Secondary | ICD-10-CM | POA: Diagnosis present

## 2023-12-01 NOTE — Progress Notes (Signed)
 [facility] Telephone:([phone]) [phone]   [phone]  HEMATOLOGY AND ONCOLOGY PROGRSS NOTE  Patient Care Team: Azalia Leo, MD as PCP - General (Internal Medicine) Dorthey Gave, PA-C as Physician Assistant (Dermatology)  CHIEF COMPLAINTS: MGUS  HISTORY OF PRESENTING ILLNESS:  Gloria Bowen 83 y.o. female returns for a follow up for MGUS.She was last seen by Dr. Rosaline Coma on 05/26/2023. In the interim, she denies any major changes to her health. She is unaccompanied for this visit.   On exam today Gloria Bowen reports her energy and appetite are stable. She is currently undergoing PT for balance issues. She reports left greater than right hip pain that is chronic in nature and intermittent in frequency. She denies nausea, vomiting or abdominal pain. She has constipation that improves with laxative as needed. She denies easy bruising or signs of bleeding. Patient denies any fevers, chills, night sweats, chest pain or cough. She has no other complaints. Rest of the 10 point ROS is below.   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    SURGICAL HISTORY: Past Surgical History:  Procedure Laterality Date   THYROIDECTOMY, PARTIAL  1991    SOCIAL HISTORY: Social History   Socioeconomic History   Marital status: Single    Spouse name: Not on file   Number of children: Not on file   Years of education: Not on file   Highest education level: Not on file  Occupational History   Not on file  Tobacco Use   Smoking status: Never   Smokeless tobacco: Never  Substance and Sexual Activity   Alcohol use: Never   Drug use: Never   Sexual activity: Not on file  Other Topics Concern   Not on file  Social History Narrative   ** Merged History Encounter **       Social  Drivers of Health   Financial Resource Strain: Not on file  Food Insecurity: Not on file  Transportation Needs: Not on file  Physical Activity: Not on file  Stress: Not on file  Social Connections: Not on file  Intimate Partner Violence: Not on file    FAMILY HISTORY: Family History  Problem Relation Age of Onset   Breast cancer Sister    Lung cancer Mother        smokeless tobacco use.     ALLERGIES:  is allergic to nabumetone and ramipril.  MEDICATIONS:  Current Outpatient Medications  Medication Sig Dispense Refill   amLODipine (NORVASC) 10 MG tablet Take 10 mg by mouth daily.     aspirin 81 MG tablet Take 81 mg by mouth daily.     atorvastatin (LIPITOR) 20 MG tablet Take 20 mg by mouth daily.     b complex vitamins capsule Take 1 capsule by mouth daily.     doxazosin (CARDURA) 4 MG tablet Take 4 mg by mouth daily.     ferrous sulfate 325 (65 FE) MG tablet Take 325 mg by mouth daily with breakfast.     gabapentin (NEURONTIN) 300 MG capsule Take by mouth.     levothyroxine (SYNTHROID) 88 MCG tablet      losartan (COZAAR) 100 MG tablet Take 100 mg by mouth daily.     metoprolol succinate (TOPROL-XL) 100 MG 24 hr tablet  Take 100 mg by mouth daily.     Potassium Chloride ER 20 MEQ TBCR Take 1 tablet by mouth daily.     WIXELA INHUB 250-50 MCG/DOSE AEPB Inhale 1 puff into the lungs daily.     furosemide (LASIX) 20 MG tablet  (Patient not taking: Reported on 12/01/2023)     No current facility-administered medications for this visit.    REVIEW OF SYSTEMS:   Constitutional: ( - ) fevers, ( - )  chills , ( - ) night sweats Eyes: ( - ) blurriness of vision, ( - ) double vision, ( - ) watery eyes Ears, nose, mouth, throat, and face: ( - ) mucositis, ( - ) sore throat Respiratory: ( - ) cough, ( - ) dyspnea, ( - ) wheezes Cardiovascular: ( - ) palpitation, ( - ) chest discomfort, ( - ) lower extremity swelling Gastrointestinal:  ( - ) nausea, ( - ) heartburn, ( - ) change in  bowel habits Skin: ( - ) abnormal skin rashes Lymphatics: ( - ) new lymphadenopathy, ( - ) easy bruising Neurological: ( + ) numbness, ( - ) tingling, ( - ) new weaknesses Behavioral/Psych: ( - ) mood change, ( - ) new changes  All other systems were reviewed with the patient and are negative.  PHYSICAL EXAMINATION: ECOG PERFORMANCE STATUS: 0 - Asymptomatic  Vitals:   12/01/23 1141 12/01/23 1144  BP: (!) 170/67 (!) 169/60  Pulse: 71   Resp: 16   Temp: (!) 97.3 F (36.3 C)   SpO2: 96%    Filed Weights   12/01/23 1141  Weight: 134 lb 1.6 oz (60.8 kg)    GENERAL: well appearing African American female in NAD  SKIN: skin color, texture, turgor are normal, no rashes or significant lesions EYES: conjunctiva are pink and non-injected, sclera clear LUNGS: clear to auscultation and percussion with normal breathing effort HEART: regular rate & rhythm and no murmurs. No lower extremity edema Musculoskeletal: no cyanosis of digits and no clubbing.  PSYCH: alert & oriented x 3, fluent speech NEURO: no focal motor/sensory deficits  LABORATORY DATA:  I have reviewed the data as listed    Latest Ref Rng & Units 11/10/2023   10:21 AM 05/26/2023   10:43 AM 11/29/2022   10:05 AM  CBC  WBC 4.0 - 10.5 K/uL 4.6  3.3  3.0   Hemoglobin 12.0 - 15.0 g/dL 9.8  82.9  9.6   Hematocrit 36.0 - 46.0 % 30.5  31.6  29.1   Platelets 150 - 400 K/uL 171  157  174        Latest Ref Rng & Units 11/10/2023   10:21 AM 05/26/2023   10:43 AM 11/29/2022   10:05 AM  CMP  Glucose 70 - 99 mg/dL 562  130  865   BUN 8 - 23 mg/dL 46  35  33   Creatinine 0.44 - 1.00 mg/dL 7.84  6.96  2.95   Sodium 135 - 145 mmol/L 143  142  142   Potassium 3.5 - 5.1 mmol/L 5.0  3.9  3.8   Chloride 98 - 111 mmol/L 113  106  107   CO2 22 - 32 mmol/L 27  30  30    Calcium 8.9 - 10.3 mg/dL 8.9  9.2  9.4   Total Protein 6.5 - 8.1 g/dL 7.3  7.0  7.0   Total Bilirubin 0.0 - 1.2 mg/dL 0.2  0.4  0.5   Alkaline Phos 38 - 126 U/L 63  60   73   AST 15 - 41 U/L 27  25  23    ALT 0 - 44 U/L 29  22  21       ASSESSMENT & PLAN Gloria Bowen is a 83 y.o. female presenting to the clinic for routine follow up for MGUS.   # IgG Kappa MGUS: --DG bone met survey from 12/14/2022 showed showed new 9 mm lucent area in the humeral head. Follow up PET scan from 12/15/2021 showed no abnormal bone lesion. Repeat bone met survey from 12/14/2022 showed new nonspecific small 0.8 cm lucent lesion in the superior left humeral head which could be degenerative versus lytic lesion. Continue to monitor and repeat in one year, May 2025.  --Last SPEP from 11/29/2022 showed M spike measuring 0.5 g/dL. Kappa light chain was trending up to 78.1, Lambda light chain 42.3 with ratio of 1.85 --Last 24 hour UPEP from 12/14/2022 measured M spike 36.4 mg/24 hr. Repeat in one year, May 2025.  PLAN: --Labs from 11/10/2023 reviewed with patient. CBC shows stable anemia with Hgb 9.8. No other cytopenias with WBC 4.6, Plt 171.Calcium level is normal. Creatinine has increased from her baseline to 2.15.  --SPEP showed stable M protein measuring 0.7 g/dL. Kappa light chain stable at 55.6, lambda light chain 37.0, ratio 1.50.  --Last bone met survey from 12/14/2022 revealed no definitive lytic lesions. Repeat annually, scheduled for 12/15/2023 --Last 24 hour UPEP from 12/14/2022 detected M protein measuring 36.4 mg/24h. Repeat annually, due now.  --RTC in 6 months with labs unless pending bone met survey and UPEP require intervention  #CKD: --Creatinine has increased to 2.15.  --Patient stopped lasix approximately 2 months ago --Will reach out to patient's nephrologist, Dr. Cindra Cree for further evaluation.   #Normocytic Anemia--stable: --Likely secondary to chronic kidney disease.  --Patient is currently taking ferrous sulfate 325 mg daily as prescribed by PCP.  --Patient denies any signs of bleeding and colonoscopy screens have been deferred due to age. --B12, Folate and  Iron were checked on 11/20/2020, all unremarkable except for iron saturation on the low end of normal at 22% and ferritin elevated at 509.    Orders Placed This Encounter  Procedures   DG Bone Survey Met    Standing Status:   Future    Expected Date:   12/15/2023    Expiration Date:   11/30/2024    Reason for Exam (SYMPTOM  OR DIAGNOSIS REQUIRED):   evaluate for lytic lesions, has MGUS    Preferred imaging location?:   [facility]   24-Hr Ur UPEP/UIFE/Light Chains/TP    Standing Status:   Future    Expiration Date:   11/30/2024    All questions were answered. The patient knows to call the clinic with any problems, questions or concerns.  I have spent a total of 25 minutes minutes of face-to-face and non-face-to-face time, preparing to see the patient, obtaining and/or reviewing separately obtained history, performing a medically appropriate examination, counseling and educating the patient,  documenting clinical information in the electronic health record, and care coordination.    Wyline Hearing PA-C Dept of Hematology and Oncology [facility] Phone: [phone]

## 2023-12-14 DIAGNOSIS — D472 Monoclonal gammopathy: Secondary | ICD-10-CM | POA: Diagnosis not present

## 2023-12-15 ENCOUNTER — Other Ambulatory Visit: Payer: Self-pay

## 2023-12-15 ENCOUNTER — Ambulatory Visit (HOSPITAL_COMMUNITY)
Admission: RE | Admit: 2023-12-15 | Discharge: 2023-12-15 | Disposition: A | Discharge status: Home / Self Care | Source: Ambulatory Visit | Attending: Physician Assistant | Admitting: Physician Assistant

## 2023-12-15 DIAGNOSIS — D472 Monoclonal gammopathy: Secondary | ICD-10-CM | POA: Diagnosis present

## 2023-12-20 LAB — UPEP/UIFE/LIGHT CHAINS/TP, 24-HR UR
% BETA, Urine: 34.6 %
ALPHA 1 URINE: 3.5 %
Albumin, U: 54.5 %
Alpha 2, Urine: 2.5 %
Free Kappa Lt Chains,Ur: 86.42 mg/L (ref 1.17–86.46)
Free Kappa/Lambda Ratio: 4.44 (ref 1.83–14.26)
Free Lambda Lt Chains,Ur: 19.47 mg/L — ABNORMAL HIGH (ref 0.27–15.21)
GAMMA GLOBULIN URINE: 5 %
Total Protein, Urine-Ur/day: 1380 mg/(24.h) — ABNORMAL HIGH (ref 30–150)
Total Protein, Urine: 197.1 mg/dL
Total Volume: 700

## 2023-12-21 ENCOUNTER — Ambulatory Visit: Payer: Self-pay | Admitting: *Deleted

## 2023-12-21 NOTE — Telephone Encounter (Signed)
-----   Message from Darilyn Edin sent at 12/21/2023  2:47 PM EDT ----- Please notify patient that urine studies show no concern for abnormal protein (M protein) and bone survey shows no evidence of lytic lesions. We will continue to monitor her. ----- Message ----- From: Interface, Lab In [place] Sent: 12/20/2023   1:36 PM EDT To: Darilyn Edin, PA-C

## 2023-12-21 NOTE — Telephone Encounter (Signed)
 Communicated response to patient. No questions.

## 2024-03-05 ENCOUNTER — Ambulatory Visit (INDEPENDENT_AMBULATORY_CARE_PROVIDER_SITE_OTHER): Admitting: Podiatry

## 2024-03-05 DIAGNOSIS — Z91198 Patient's noncompliance with other medical treatment and regimen for other reason: Secondary | ICD-10-CM

## 2024-03-06 ENCOUNTER — Telehealth: Payer: Self-pay | Admitting: Podiatry

## 2024-03-06 NOTE — Telephone Encounter (Signed)
-----   Message from Delon LITTIE Merlin sent at 03/06/2024 12:49 PM EDT ----- Patient needs to be rescheduled due to power outage yesterday.

## 2024-03-06 NOTE — Telephone Encounter (Signed)
 Called patient to reschedule appointment. Offered [place] location but patient does not drive and relies on transportation to appointments. Have added to wait list and will schedule her through that.

## 2024-03-06 NOTE — Progress Notes (Signed)
 1. Failure to attend appointment with reason given    Appt canceled by clinic. Power outage in office.

## 2024-03-13 ENCOUNTER — Encounter: Payer: Self-pay | Admitting: Podiatry

## 2024-03-13 ENCOUNTER — Ambulatory Visit (INDEPENDENT_AMBULATORY_CARE_PROVIDER_SITE_OTHER): Admitting: Podiatry

## 2024-03-13 DIAGNOSIS — M79675 Pain in left toe(s): Secondary | ICD-10-CM

## 2024-03-13 DIAGNOSIS — L84 Corns and callosities: Secondary | ICD-10-CM | POA: Diagnosis not present

## 2024-03-13 DIAGNOSIS — B351 Tinea unguium: Secondary | ICD-10-CM | POA: Diagnosis not present

## 2024-03-13 DIAGNOSIS — M79674 Pain in right toe(s): Secondary | ICD-10-CM | POA: Diagnosis not present

## 2024-03-13 DIAGNOSIS — E1151 Type 2 diabetes mellitus with diabetic peripheral angiopathy without gangrene: Secondary | ICD-10-CM

## 2024-03-18 ENCOUNTER — Encounter: Payer: Self-pay | Admitting: Podiatry

## 2024-03-18 DIAGNOSIS — E1142 Type 2 diabetes mellitus with diabetic polyneuropathy: Secondary | ICD-10-CM | POA: Insufficient documentation

## 2024-03-18 DIAGNOSIS — I700 Atherosclerosis of aorta: Secondary | ICD-10-CM | POA: Insufficient documentation

## 2024-03-18 DIAGNOSIS — I2510 Atherosclerotic heart disease of native coronary artery without angina pectoris: Secondary | ICD-10-CM | POA: Insufficient documentation

## 2024-03-18 DIAGNOSIS — G629 Polyneuropathy, unspecified: Secondary | ICD-10-CM | POA: Insufficient documentation

## 2024-03-18 DIAGNOSIS — N2581 Secondary hyperparathyroidism of renal origin: Secondary | ICD-10-CM | POA: Insufficient documentation

## 2024-03-18 NOTE — Progress Notes (Signed)
  Subjective:  Patient ID: Gloria Bowen, female    DOB: [birth year],  MRN: [id number]  FABIANNA KEATS presents to clinic today for at risk foot care. Pt has h/o NIDDM with PAD and callus(es) b/l lower extremities and painful mycotic toenails that are difficult to trim. Painful toenails interfere with ambulation. Aggravating factors include wearing enclosed shoe gear. Pain is relieved with periodic professional debridement. Painful calluses are aggravated when weightbearing with and without shoegear. Pain is relieved with periodic professional debridement.  Chief Complaint  Patient presents with   Diabetes    Diet control diabetes. A1C ? SABRA Toenail trim. LOV with PCP 12/2023   New problem(s): None.   PCP is Stephane Leita DEL, MD.  Allergies  Allergen Reactions   Nabumetone Other (See Comments)   Ramipril Other (See Comments)    Review of Systems: Negative except as noted in the HPI.  Objective: No changes noted in today's physical examination. There were no vitals filed for this visit. [place] KERRYANN ALLAIRE is a pleasant 83 y.o. female WD, WN in NAD. AAO x 3.  Vascular Examination: CFT less than 3 seconds. Pedal pulses RLE palpable and nonpalpable pulses LLE. Digital hair absent. Skin temperature gradient warm to warn b/l. No ischemia or gangrene. No cyanosis or clubbing noted b/l.    Neurological Examination: Sensation grossly intact b/l with 10 gram monofilament. Vibratory sensation intact b/l.   Dermatological Examination: Pedal skin with normal turgor, texture and tone b/l. No open wounds. No interdigital macerations b/l. Toenails 1-5 b/l thickened, discolored, dystrophic with subungual debris. There is pain on palpation to dorsal aspect of nailplates. Hyperkeratotic lesion(s) submet head 5 b/l.  No erythema, no edema, no drainage, no fluctuance.  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  Musculoskeletal Examination: Muscle strength 5/5 to all lower extremity  muscle groups bilaterally. HAV with bunion deformity noted b/l LE. Hammertoe deformity noted 2-5 b/l.  Radiographs: None  Assessment/Plan: 1. Pain due to onychomycosis of toenails of both feet   2. Callus   3. Type II diabetes mellitus with peripheral circulatory disorder [facility])   Consent given for treatment. Patient examined. All patient's and/or POA's questions/concerns addressed on today's visit. Mycotic toenails 1-5 debrided in length and girth without incident. Callus(es) submet head 5 b/l pared with sharp debridement without incident. Continue foot and shoe inspections daily. Monitor blood glucose per PCP/Endocrinologist's recommendations.Continue soft, supportive shoe gear daily. Report any pedal injuries to medical professional. Call office if there are any quesitons/concerns.  Return in about 3 months (around 06/13/2024).  Delon LITTIE Merlin, DPM      [place] LOCATION: [address] [place] [address]                   Office [phone]   [facility] LOCATION: [address] [place], [place] [address] Office [phone]

## 2024-05-21 ENCOUNTER — Inpatient Hospital Stay: Attending: Physician Assistant

## 2024-05-21 ENCOUNTER — Other Ambulatory Visit: Payer: Self-pay | Admitting: Hematology and Oncology

## 2024-05-21 DIAGNOSIS — D472 Monoclonal gammopathy: Secondary | ICD-10-CM

## 2024-05-21 DIAGNOSIS — D649 Anemia, unspecified: Secondary | ICD-10-CM | POA: Diagnosis not present

## 2024-05-21 LAB — CBC WITH DIFFERENTIAL (CANCER CENTER ONLY)
Abs Immature Granulocytes: 0 K/uL (ref 0.00–0.07)
Basophils Absolute: 0 K/uL (ref 0.0–0.1)
Basophils Relative: 1 %
Eosinophils Absolute: 0.1 K/uL (ref 0.0–0.5)
Eosinophils Relative: 3 %
HCT: 32.6 % — ABNORMAL LOW (ref 36.0–46.0)
Hemoglobin: 10.4 g/dL — ABNORMAL LOW (ref 12.0–15.0)
Immature Granulocytes: 0 %
Lymphocytes Relative: 40 %
Lymphs Abs: 1.7 K/uL (ref 0.7–4.0)
MCH: 28.9 pg (ref 26.0–34.0)
MCHC: 31.9 g/dL (ref 30.0–36.0)
MCV: 90.6 fL (ref 80.0–100.0)
Monocytes Absolute: 0.4 K/uL (ref 0.1–1.0)
Monocytes Relative: 10 %
Neutro Abs: 2 K/uL (ref 1.7–7.7)
Neutrophils Relative %: 46 %
Platelet Count: 176 K/uL (ref 150–400)
RBC: 3.6 MIL/uL — ABNORMAL LOW (ref 3.87–5.11)
RDW: 12.8 % (ref 11.5–15.5)
WBC Count: 4.3 K/uL (ref 4.0–10.5)
nRBC: 0 % (ref 0.0–0.2)

## 2024-05-21 LAB — CMP (CANCER CENTER ONLY)
ALT: 30 U/L (ref 0–44)
AST: 27 U/L (ref 15–41)
Albumin: 3.6 g/dL (ref 3.5–5.0)
Alkaline Phosphatase: 65 U/L (ref 38–126)
Anion gap: 5 (ref 5–15)
BUN: 34 mg/dL — ABNORMAL HIGH (ref 8–23)
CO2: 25 mmol/L (ref 22–32)
Calcium: 9.4 mg/dL (ref 8.9–10.3)
Chloride: 114 mmol/L — ABNORMAL HIGH (ref 98–111)
Creatinine: 1.83 mg/dL — ABNORMAL HIGH (ref 0.44–1.00)
GFR, Estimated: 27 mL/min — ABNORMAL LOW (ref >60)
Glucose, Bld: 102 mg/dL — ABNORMAL HIGH (ref 70–99)
Potassium: 4.5 mmol/L (ref 3.5–5.1)
Sodium: 144 mmol/L (ref 135–145)
Total Bilirubin: 0.3 mg/dL (ref 0.0–1.2)
Total Protein: 7.1 g/dL (ref 6.5–8.1)

## 2024-05-21 LAB — LACTATE DEHYDROGENASE: LDH: 170 U/L (ref 98–192)

## 2024-05-22 LAB — KAPPA/LAMBDA LIGHT CHAINS
Kappa free light chain: 58.7 mg/L — ABNORMAL HIGH (ref 3.3–19.4)
Kappa, lambda light chain ratio: 1.29 (ref 0.26–1.65)
Lambda free light chains: 45.6 mg/L — ABNORMAL HIGH (ref 5.7–26.3)

## 2024-05-27 LAB — MULTIPLE MYELOMA PANEL, SERUM
Albumin SerPl Elph-Mcnc: 3.2 g/dL (ref 2.9–4.4)
Albumin/Glob SerPl: 1 (ref 0.7–1.7)
Alpha 1: 0.2 g/dL (ref 0.0–0.4)
Alpha2 Glob SerPl Elph-Mcnc: 0.9 g/dL (ref 0.4–1.0)
B-Globulin SerPl Elph-Mcnc: 0.9 g/dL (ref 0.7–1.3)
Gamma Glob SerPl Elph-Mcnc: 1.4 g/dL (ref 0.4–1.8)
Globulin, Total: 3.4 g/dL (ref 2.2–3.9)
IgA: 279 mg/dL (ref 64–422)
IgG (Immunoglobin G), Serum: 1570 mg/dL (ref 586–1602)
IgM (Immunoglobulin M), Srm: 111 mg/dL (ref 26–217)
M Protein SerPl Elph-Mcnc: 0.6 g/dL — ABNORMAL HIGH
Total Protein ELP: 6.6 g/dL (ref 6.0–8.5)

## 2024-06-03 NOTE — Progress Notes (Unsigned)
 [facility] [phone]   [phone]  HEMATOLOGY AND ONCOLOGY PROGRSS NOTE  Patient Care Team: Stephane Leita DEL, MD as PCP - General (Internal Medicine) [place], Andrez SAUNDERS, PA-C (Inactive) as Physician Assistant (Dermatology)  CHIEF COMPLAINTS: MGUS  HISTORY OF PRESENTING ILLNESS:  Gloria Bowen 83 y.o. female returns for a follow up for MGUS.She was last seen on 12/01/2023. In the interim, she denies any major changes to her health. She is unaccompanied for this visit.   On exam today Gloria Bowen reports that her energy levels are fairly stable.  She is able to complete her baseline ADLs on her own.  She denies nausea, vomiting or bowel habit changes.  She denies any new bone or back pain.  She reports having some discomfort in her left wrist and suspects may be some tendinitis.  She denies fevers, chills, night sweats, shortness of breath, chest pain or cough.  She has no other complaints.  Rest of the 10 point ROS as below.  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    SURGICAL HISTORY: Past Surgical History:  Procedure Laterality Date   THYROIDECTOMY, PARTIAL  1991    SOCIAL HISTORY: Social History   Socioeconomic History   Marital status: Single    Spouse name: Not on file   Number of children: Not on file   Years of education: Not on file   Highest education level: Not on file  Occupational History   Not on file  Tobacco Use   Smoking status: Never   Smokeless tobacco: Never  Substance and Sexual Activity   Alcohol use: Never   Drug use: Never   Sexual activity: Not on file  Other Topics Concern   Not on file  Social History Narrative   ** Merged History Encounter **       Social Drivers of Health   Financial Resource Strain: Not  on file  Food Insecurity: Not on file  Transportation Needs: Not on file  Physical Activity: Not on file  Stress: Not on file  Social Connections: Not on file  Intimate Partner Violence: Not on file    FAMILY HISTORY: Family History  Problem Relation Age of Onset   Breast cancer Sister    Lung cancer Mother        smokeless tobacco use.     ALLERGIES:  is allergic to nabumetone and ramipril.  MEDICATIONS:  Current Outpatient Medications  Medication Sig Dispense Refill   amLODipine (NORVASC) 10 MG tablet Take 10 mg by mouth daily.     aspirin 81 MG tablet Take 81 mg by mouth daily.     atorvastatin (LIPITOR) 20 MG tablet Take 20 mg by mouth daily.     b complex vitamins capsule Take 1 capsule by mouth daily.     doxazosin (CARDURA) 4 MG tablet Take 4 mg by mouth daily.     ferrous sulfate 325 (65 FE) MG tablet Take 325 mg by mouth daily with breakfast.     furosemide (LASIX) 20 MG tablet  (Patient taking differently: as needed.)     gabapentin (NEURONTIN) 300 MG capsule Take by mouth.     levothyroxine (SYNTHROID) 88 MCG tablet      losartan (COZAAR) 100 MG tablet Take 100 mg  by mouth daily.     metoprolol succinate (TOPROL-XL) 100 MG 24 hr tablet Take 100 mg by mouth daily.     Potassium Chloride ER 20 MEQ TBCR Take 1 tablet by mouth daily.     WIXELA INHUB 250-50 MCG/DOSE AEPB Inhale 1 puff into the lungs daily.     No current facility-administered medications for this visit.    REVIEW OF SYSTEMS:   Constitutional: ( - ) fevers, ( - )  chills , ( - ) night sweats Eyes: ( - ) blurriness of vision, ( - ) double vision, ( - ) watery eyes Ears, nose, mouth, throat, and face: ( - ) mucositis, ( - ) sore throat Respiratory: ( - ) cough, ( - ) dyspnea, ( - ) wheezes Cardiovascular: ( - ) palpitation, ( - ) chest discomfort, ( - ) lower extremity swelling Gastrointestinal:  ( - ) nausea, ( - ) heartburn, ( - ) change in bowel habits Skin: ( - ) abnormal skin rashes Lymphatics:  ( - ) new lymphadenopathy, ( - ) easy bruising Neurological: ( + ) numbness, ( - ) tingling, ( - ) new weaknesses Behavioral/Psych: ( - ) mood change, ( - ) new changes  All other systems were reviewed with the patient and are negative.  PHYSICAL EXAMINATION: ECOG PERFORMANCE STATUS: 0 - Asymptomatic  Vitals:   06/04/24 1025  BP: (!) 171/75  Pulse: 71  Resp: 19  Temp: (!) 97.5 F (36.4 C)  SpO2: 100%    Filed Weights   06/04/24 1025  Weight: 133 lb 6.4 oz (60.5 kg)     GENERAL: well appearing African American female in NAD  SKIN: skin color, texture, turgor are normal, no rashes or significant lesions EYES: conjunctiva are pink and non-injected, sclera clear LUNGS: clear to auscultation and percussion with normal breathing effort HEART: regular rate & rhythm and no murmurs. No lower extremity edema Musculoskeletal: no cyanosis of digits and no clubbing.  PSYCH: alert & oriented x 3, fluent speech NEURO: no focal motor/sensory deficits  LABORATORY DATA:  I have reviewed the data as listed    Latest Ref Rng & Units 05/21/2024    9:37 AM 11/10/2023   10:21 AM 05/26/2023   10:43 AM  CBC  WBC 4.0 - 10.5 K/uL 4.3  4.6  3.3   Hemoglobin 12.0 - 15.0 g/dL 89.5  9.8  89.9   Hematocrit 36.0 - 46.0 % 32.6  30.5  31.6   Platelets 150 - 400 K/uL 176  171  157        Latest Ref Rng & Units 05/21/2024    9:37 AM 11/10/2023   10:21 AM 05/26/2023   10:43 AM  CMP  Glucose 70 - 99 mg/dL 897  885  877   BUN 8 - 23 mg/dL 34  46  35   Creatinine 0.44 - 1.00 mg/dL 8.16  7.84  8.25   Sodium 135 - 145 mmol/L 144  143  142   Potassium 3.5 - 5.1 mmol/L 4.5  5.0  3.9   Chloride 98 - 111 mmol/L 114  113  106   CO2 22 - 32 mmol/L 25  27  30    Calcium 8.9 - 10.3 mg/dL 9.4  8.9  9.2   Total Protein 6.5 - 8.1 g/dL 7.1  7.3  7.0   Total Bilirubin 0.0 - 1.2 mg/dL 0.3  0.2  0.4   Alkaline Phos 38 - 126 U/L 65  63  60  AST 15 - 41 U/L 27  27  25    ALT 0 - 44 U/L 30  29  22        ASSESSMENT & PLAN Gloria Bowen is a 83 y.o. female presenting to the clinic for routine follow up for MGUS.   # IgG Kappa MGUS: --DG bone met survey from 12/14/2022 showed showed new 9 mm lucent area in the humeral head. Follow up PET scan from 12/15/2021 showed no abnormal bone lesion. Repeat bone met survey from 12/14/2022 showed new nonspecific small 0.8 cm lucent lesion in the superior left humeral head which could be degenerative versus lytic lesion. Continue to monitor and repeat in one year, May 2025.  --Last SPEP from 11/29/2022 showed M spike measuring 0.5 g/dL. Kappa light chain was trending up to 78.1, Lambda light chain 42.3 with ratio of 1.85 --Last 24 hour UPEP from 12/14/2022 measured M spike 36.4 mg/24 hr. Repeat in one year, May 2025.  PLAN: --Labs from 05/21/2024 reviewed with patient. CBC shows improved anemia with Hgb 10.4. No other cytopenias with WBC 4.3, Plt 176.Calcium level is normal. Creatinine has improved to 1.83. --SPEP showed stable M protein measuring 0.6 g/dL. Kappa light chain stable at 58.7, lambda light chain 45.6, ratio 1.29. --Last bone met survey from 12/14/2023 revealed no definitive lytic lesions. Repeat annually, due May 2026.  --Last 24 hour UPEP from 12/14/2023 did not detect M protein.Next UPEP is due May 2026.  --RTC in 6 months with labs  #CKD: --Creatinine has improved to 1.83 --Under the care of nephrologist, Dr. Macel.  #Normocytic Anemia--stable: --Likely secondary to chronic kidney disease.  --Patient is currently taking ferrous sulfate 325 mg daily as prescribed by PCP.  --Patient denies any signs of bleeding and colonoscopy screens have been deferred due to age. --B12, Folate and Iron were checked on 11/20/2020, all unremarkable except for iron saturation on the low end of normal at 22% and ferritin elevated at 509.   #Left wrist pain: --Etiology unknown, will obtain xrays today to further evaluate.    Orders Placed This  Encounter  Procedures   DG Wrist 2 Views Left    Standing Status:   Future    Expected Date:   06/04/2024    Expiration Date:   06/04/2025    Reason for Exam (SYMPTOM  OR DIAGNOSIS REQUIRED):   left wrist pain    Preferred imaging location?:   [facility]    All questions were answered. The patient knows to call the clinic with any problems, questions or concerns.  I have spent a total of 25 minutes minutes of face-to-face and non-face-to-face time, preparing to see the patient, obtaining and/or reviewing separately obtained history, performing a medically appropriate examination, counseling and educating the patient,  documenting clinical information in the electronic health record, and care coordination.    Johnston Police PA-C Dept of Hematology and Oncology [facility] Phone: [phone]

## 2024-06-04 ENCOUNTER — Inpatient Hospital Stay: Attending: Physician Assistant | Admitting: Physician Assistant

## 2024-06-04 VITALS — BP 171/75 | HR 71 | Temp 97.5°F | Resp 19 | Ht <= 58 in | Wt 133.4 lb

## 2024-06-04 DIAGNOSIS — E1122 Type 2 diabetes mellitus with diabetic chronic kidney disease: Secondary | ICD-10-CM | POA: Diagnosis not present

## 2024-06-04 DIAGNOSIS — D472 Monoclonal gammopathy: Secondary | ICD-10-CM | POA: Insufficient documentation

## 2024-06-04 DIAGNOSIS — M25532 Pain in left wrist: Secondary | ICD-10-CM | POA: Insufficient documentation

## 2024-06-04 DIAGNOSIS — E039 Hypothyroidism, unspecified: Secondary | ICD-10-CM | POA: Insufficient documentation

## 2024-06-04 DIAGNOSIS — Z803 Family history of malignant neoplasm of breast: Secondary | ICD-10-CM | POA: Insufficient documentation

## 2024-06-04 DIAGNOSIS — N183 Chronic kidney disease, stage 3 unspecified: Secondary | ICD-10-CM | POA: Insufficient documentation

## 2024-06-04 DIAGNOSIS — D649 Anemia, unspecified: Secondary | ICD-10-CM | POA: Insufficient documentation

## 2024-06-04 DIAGNOSIS — Z7982 Long term (current) use of aspirin: Secondary | ICD-10-CM | POA: Diagnosis not present

## 2024-06-04 DIAGNOSIS — Z801 Family history of malignant neoplasm of trachea, bronchus and lung: Secondary | ICD-10-CM | POA: Insufficient documentation

## 2024-06-04 DIAGNOSIS — Z79899 Other long term (current) drug therapy: Secondary | ICD-10-CM | POA: Insufficient documentation

## 2024-06-04 DIAGNOSIS — I129 Hypertensive chronic kidney disease with stage 1 through stage 4 chronic kidney disease, or unspecified chronic kidney disease: Secondary | ICD-10-CM | POA: Insufficient documentation

## 2024-06-25 ENCOUNTER — Ambulatory Visit (INDEPENDENT_AMBULATORY_CARE_PROVIDER_SITE_OTHER): Admitting: Podiatry

## 2024-06-25 DIAGNOSIS — B351 Tinea unguium: Secondary | ICD-10-CM | POA: Diagnosis not present

## 2024-06-25 DIAGNOSIS — E1151 Type 2 diabetes mellitus with diabetic peripheral angiopathy without gangrene: Secondary | ICD-10-CM

## 2024-06-25 DIAGNOSIS — M79674 Pain in right toe(s): Secondary | ICD-10-CM

## 2024-06-25 DIAGNOSIS — M79675 Pain in left toe(s): Secondary | ICD-10-CM

## 2024-06-25 DIAGNOSIS — L84 Corns and callosities: Secondary | ICD-10-CM | POA: Diagnosis not present

## 2024-06-25 NOTE — Progress Notes (Signed)
  Subjective:  Patient ID: Gloria Bowen, female    DOB: Feb 25, 1941,  MRN: [id number]  Gloria Bowen presents to clinic today for at risk foot care. Pt has Bowen/o NIDDM with PAD and callus(es) of both feet and painful mycotic toenails that are difficult to trim. Painful toenails interfere with ambulation. Aggravating factors include wearing enclosed shoe gear. Pain is relieved with periodic professional debridement. Painful calluses are aggravated when weightbearing with and without shoegear. Pain is relieved with periodic professional debridement.  Chief Complaint  Patient presents with   RFC    RFC. L great toe nail sore on medial side.  Not Diabetic. 81 mg Asprin Wile, Gloria H, MD (PCP).  06/11/24.  PT signed Medicaid ABN today 06/25/24.    New problem(s): None.   PCP is Gloria Leita DEL, MD.  Allergies  Allergen Reactions   Nabumetone Other (See Comments)   Ramipril Other (See Comments)    Review of Systems: Negative except as noted in the HPI.  Objective: No changes noted in today's physical examination. There were no vitals filed for this visit. Gloria Bowen is a pleasant 83 y.o. female WD, WN in NAD. AAO x 3.  Vascular Examination: CFT less than 3 seconds. Pedal pulses RLE palpable and nonpalpable pulses LLE. Digital hair absent. Skin temperature gradient warm to warn b/l. No ischemia or gangrene. No cyanosis or clubbing noted b/l.    Neurological Examination: Sensation grossly intact b/l with 10 gram monofilament. Vibratory sensation intact b/l.   Dermatological Examination: Pedal skin with normal turgor, texture and tone b/l. No open wounds. No interdigital macerations b/l. Toenails 1-5 b/l thickened, discolored, dystrophic with subungual debris. There is pain on palpation to dorsal aspect of nailplates. Hyperkeratotic lesion(s) submet head 5 b/l.  No erythema, no edema, no drainage, no fluctuance.  Longitudinal melanonychia noted left 2nd digit  unchanged.  Musculoskeletal Examination: Muscle strength 5/5 to all lower extremity muscle groups bilaterally. HAV with bunion deformity noted b/l LE. Hammertoe deformity noted 2-5 b/l.  Radiographs: None  Assessment/Plan: 1. Pain due to onychomycosis of toenails of both feet   2. Callus   3. Type II diabetes mellitus with peripheral circulatory disorder [facility])   Patient was evaluated and treated. All patient's and/or POA's questions/concerns addressed on today's visit. Toenails 1-5 b/l debrided in length and girth without incident. Callus(es) submet head 5 left foot and submet head 5 right foot pared with sharp debridement without incident. Continue daily foot inspections and monitor blood glucose per PCP/Endocrinologist's recommendations. Continue soft, supportive shoe gear daily. Report any pedal injuries to medical professional. Call office if there are any questions/concerns.  Return in about 3 months (around 09/25/2024).  Gloria Bowen, DPM      [facility] LOCATION: [address], [place] [address]                   Office [phone]   [facility] LOCATION: [address] [place], [place] [address] Office [phone]

## 2024-06-30 ENCOUNTER — Encounter: Payer: Self-pay | Admitting: Podiatry

## 2024-10-08 ENCOUNTER — Ambulatory Visit: Admitting: Podiatry

## 2024-12-03 ENCOUNTER — Inpatient Hospital Stay

## 2024-12-17 ENCOUNTER — Inpatient Hospital Stay: Admitting: Physician Assistant
